# Patient Record
Sex: Female | Born: 1990 | Race: White | Hispanic: No | Marital: Single | Smoking: Never smoker
Health system: Southern US, Community
[De-identification: ages and names within clinical notes are randomized; demographics above are authoritative.]

## PROBLEM LIST (undated history)

## (undated) DIAGNOSIS — A048 Other specified bacterial intestinal infections: Secondary | ICD-10-CM

## (undated) DIAGNOSIS — F4329 Adjustment disorder with other symptoms: Secondary | ICD-10-CM

## (undated) HISTORY — DX: Adjustment disorder with other symptoms: F43.29

## (undated) HISTORY — DX: Other specified bacterial intestinal infections: A04.8

---

## 2010-05-20 ENCOUNTER — Ambulatory Visit (INDEPENDENT_AMBULATORY_CARE_PROVIDER_SITE_OTHER): Payer: Private Health Insurance - Indemnity | Admitting: Family Medicine

## 2010-05-20 ENCOUNTER — Encounter: Payer: Self-pay | Admitting: Family Medicine

## 2010-05-20 DIAGNOSIS — J020 Streptococcal pharyngitis: Secondary | ICD-10-CM

## 2010-05-26 NOTE — Assessment & Plan Note (Signed)
Summary: STOMACH PAIN,SORE THROAT,FEVER/TJ rm 4   Vital Signs:  Patient Profile:   20 Years Old Female CC:      stomach ache, sore throat, fever Height:     59 inches Weight:      114.50 pounds O2 Sat:      98 % O2 treatment:    Room Air Temp:     100.7 degrees F oral Pulse rate:   110 / minute Resp:     16 per minute BP sitting:   133 / 88  (left arm) Cuff size:   regular  Vitals Entered By: Clemens Catholic LPN (May 20, 2010 1:15 PM)                  Updated Prior Medication List: MICROGESTIN FE 1/20 1-20 MG-MCG TABS (NORETHIN ACE-ETH ESTRAD-FE)   Current Allergies: No known allergies History of Present Illness Chief Complaint: stomach ache, sore throat, fever History of Present Illness:  Subjective: Patient complains of sore throat that started last night.  She has been fatigued for about 3 days.  She also complains of intermittent recurring redness around her umbilicus.  For the past 3 days she has noted some intermittent vague tenderness in her right lower quadrant abdomen. No cough No pleuritic pain No wheezing No nasal congestion No post-nasal drainage No sinus pain/pressure No itchy/red eyes No earache No hemoptysis No SOB No nausea No vomiting No urinary symptoms  No diarrhea No skin rashes + myalgias + headache    REVIEW OF SYSTEMS Constitutional Symptoms       Complains of fever, chills, night sweats, and weight loss.     Denies weight gain and fatigue.  Eyes       Denies change in vision, eye pain, eye discharge, glasses, contact lenses, and eye surgery. Ear/Nose/Throat/Mouth       Complains of sore throat.      Denies hearing loss/aids, change in hearing, ear pain, ear discharge, dizziness, frequent runny nose, frequent nose bleeds, sinus problems, hoarseness, and tooth pain or bleeding.  Respiratory       Denies dry cough, productive cough, wheezing, shortness of breath, asthma, bronchitis, and emphysema/COPD.  Cardiovascular  Denies murmurs, chest pain, and tires easily with exhertion.    Gastrointestinal       Complains of stomach pain.      Denies nausea/vomiting, diarrhea, constipation, blood in bowel movements, and indigestion. Genitourniary       Denies painful urination, kidney stones, and loss of urinary control. Neurological       Complains of headaches.      Denies paralysis, seizures, and fainting/blackouts. Musculoskeletal       Complains of redness and swelling.      Denies muscle pain, joint pain, joint stiffness, decreased range of motion, muscle weakness, and gout.      Comments: spine hurts Skin       Denies bruising, unusual mles/lumps or sores, and hair/skin or nail changes.  Psych       Denies mood changes, temper/anger issues, anxiety/stress, speech problems, depression, and sleep problems. Other Comments: pt c/o sore throat, fever, stomach ache (comes and goes on the RLQ), HA, neck and spine hurts x 2days. she has taken IBF.  her mother also would like to have her belly button looked at, she states that it has been red for a while.    Past History:  Past Medical History: Unremarkable  Past Surgical History: Denies surgical history  Family History: Family History High  cholesterol Family History of Melanoma  Social History: Never Smoked Alcohol use-no Drug use-no Smoking Status:  never Drug Use:  no   Objective:  Appearance:  Patient appears healthy, stated age, and in no acute distress  Eyes:  Pupils are equal, round, and reactive to light and accomdation.  Extraocular movement is intact.  Conjunctivae are not inflamed.  Ears:  Canals normal.  Tympanic membranes normal.   Nose:  No congestion or sinus tenderness Pharynx:  Erythematous and slightly swollen without obstruction.  Nol exudate.  Neck:  Supple.  Tender shotty anterior nodes are palpated bilaterally.  Lungs:  Clear to auscultation.  Breath sounds are equal.  Heart:  Regular rate and rhythm without murmurs, rubs,  or gallops.  Abdomen:  Minimal tenderness right lower quadrant without masses or hepatosplenomegaly.  Bowel sounds are present.  No CVA or flank tenderness.  Negative iliopsoas and obdurator tests Skin:  Erythema around umbilicus.  No tenderness or swelling.  No discharge Rapid strep test positive. Assessment New Problems: PHARYNGITIS, STREPTOCOCCAL (ICD-034.0) FAMILY HISTORY OF MELANOMA (ICD-V16.8)   Plan New Medications/Changes: KETOCONAZOLE 2 % CREA (KETOCONAZOLE) Apply thin layer to affected area two times a day  #15gn x 1, 05/20/2010, Donna Christen MD PENICILLIN V POTASSIUM 500 MG TABS (PENICILLIN V POTASSIUM) 1 by mouth two times a day for 10 days  #20 x 0, 05/20/2010, Donna Christen MD  New Orders: Rapid Strep [15400] New Patient Level III [99203] Planning Comments:   Begin penicillin.  May take ibuprofen for pain/fever. Follow-up with PCP if not improving.   The patient and/or caregiver has been counseled thoroughly with regard to medications prescribed including dosage, schedule, interactions, rationale for use, and possible side effects and they verbalize understanding.  Diagnoses and expected course of recovery discussed and will return if not improved as expected or if the condition worsens. Patient and/or caregiver verbalized understanding.  Prescriptions: KETOCONAZOLE 2 % CREA (KETOCONAZOLE) Apply thin layer to affected area two times a day  #15gn x 1   Entered and Authorized by:   Donna Christen MD   Signed by:   Donna Christen MD on 05/20/2010   Method used:   Print then Give to Patient   RxID:   [id number] PENICILLIN V POTASSIUM 500 MG TABS (PENICILLIN V POTASSIUM) 1 by mouth two times a day for 10 days  #20 x 0   Entered and Authorized by:   Donna Christen MD   Signed by:   Donna Christen MD on 05/20/2010   Method used:   Print then Give to Patient   RxID:   [id number]   Patient Instructions: 1)  May take Ibuprofen 200mg , 4 tabs every 8 hours with food  for sore throat and headache.  Orders Added: 1)  Rapid Strep [67341] 2)  New Patient Level III [99203]    Laboratory Results  Date/Time Received: May 20, 2010 1:32 PM  Date/Time Reported: May 20, 2010 1:32 PM   Other Tests  Rapid Strep: positive  Kit Test Internal QC: Negative   (Normal Range: Negative)

## 2010-05-26 NOTE — Letter (Signed)
Summary: Out of Work  [facility]  [address]   [place], [place] [address]   Phone: [phone]  Fax: [phone]    May 20, 2010   Employee:  Miki Kins    To Whom It May Concern:   For Medical reasons, please excuse the above named employee from work today and tomorrow.   If you need additional information, please feel free to contact our office.         Sincerely,    Donna Christen MD

## 2010-05-26 NOTE — Letter (Signed)
Summary: Handout Printed  Printed Handout:  - Rheumatic Fever 

## 2010-12-10 ENCOUNTER — Encounter: Payer: Self-pay | Admitting: Family Medicine

## 2010-12-10 ENCOUNTER — Inpatient Hospital Stay (INDEPENDENT_AMBULATORY_CARE_PROVIDER_SITE_OTHER)
Admission: RE | Admit: 2010-12-10 | Discharge: 2010-12-10 | Disposition: A | Discharge status: Home / Self Care | Payer: Private Health Insurance - Indemnity | Source: Ambulatory Visit | Attending: Family Medicine | Admitting: Family Medicine

## 2010-12-10 DIAGNOSIS — R1031 Right lower quadrant pain: Secondary | ICD-10-CM

## 2010-12-10 DIAGNOSIS — M8530 Osteitis condensans, unspecified site: Secondary | ICD-10-CM

## 2010-12-10 LAB — CONVERTED CEMR LAB
Bilirubin Urine: NEGATIVE
Glucose, Urine, Semiquant: NEGATIVE
Ketones, urine, test strip: NEGATIVE
WBC Urine, dipstick: NEGATIVE
pH: 6

## 2010-12-12 ENCOUNTER — Telehealth (INDEPENDENT_AMBULATORY_CARE_PROVIDER_SITE_OTHER): Payer: Self-pay

## 2011-02-08 NOTE — Progress Notes (Signed)
Summary: Stomach Pain (rm 5)   Vital Signs:  Patient Profile:   20 Years Old Female CC:      RLQ Abdominal Pain x 1 week LMP:     11/26/2010 Height:     60 inches Weight:      119 pounds O2 Sat:      99 % O2 treatment:    Room Air Temp:     98.0 degrees F oral Pulse rate:   81 / minute Resp:     16 per minute BP sitting:   130 / 82  (left arm) Cuff size:   regular  Pt. in pain?   yes    Location:   abdomen    Type:       dull  Vitals Entered By: Lajean Saver RN (December 10, 2010 1:17 PM)  Menstrual History: LMP (date): 11/26/2010 LMP - Character: normal                   Updated Prior Medication List: MICROGESTIN FE 1/20 1-20 MG-MCG TABS (NORETHIN ACE-ETH ESTRAD-FE)   Current Allergies: No known allergies History of Present Illness Chief Complaint: RLQ Abdominal Pain x 1 week History of Present Illness:  Subjective:  Patient complains of onset of vague constant right lower quadrant pain  about one week ago that does not radiate.  The pain is possibly somewhat worse after eating.  Feels better standing.  No nausea/vomiting.  No fevers, chills, and sweats.  No change in bowel movements.  No urinary symptoms.  No pelvic pain.  No vaginal discharge.  Last menstrual period normal.  No rash.  No recent change in physical activities.    REVIEW OF SYSTEMS Constitutional Symptoms      Denies fever, chills, night sweats, weight loss, weight gain, and fatigue.  Eyes       Denies change in vision, eye pain, eye discharge, glasses, contact lenses, and eye surgery. Ear/Nose/Throat/Mouth       Denies hearing loss/aids, change in hearing, ear pain, ear discharge, dizziness, frequent runny nose, frequent nose bleeds, sinus problems, sore throat, hoarseness, and tooth pain or bleeding.  Respiratory       Denies dry cough, productive cough, wheezing, shortness of breath, asthma, bronchitis, and emphysema/COPD.  Cardiovascular       Denies murmurs, chest pain, and tires easily  with exhertion.    Gastrointestinal       Complains of stomach pain.      Denies nausea/vomiting, diarrhea, constipation, blood in bowel movements, and indigestion. Genitourniary       Denies painful urination, blood or discharge from vagina, kidney stones, and loss of urinary control. Neurological       Denies paralysis, seizures, and fainting/blackouts. Musculoskeletal       Denies muscle pain, joint pain, joint stiffness, decreased range of motion, redness, swelling, muscle weakness, and gout.  Skin       Denies bruising, unusual mles/lumps or sores, and hair/skin or nail changes.  Psych       Denies mood changes, temper/anger issues, anxiety/stress, speech problems, depression, and sleep problems. Other Comments: Patient c/o RLQ abdominal pain x 1 week. Denies urinary abnormalities or N/V/D   Past History:  Past Medical History: Reviewed history from 05/20/2010 and no changes required. Unremarkable  Past Surgical History: Reviewed history from 05/20/2010 and no changes required. Denies surgical history  Family History: Reviewed history from 05/20/2010 and no changes required. Family History High cholesterol Family History of Melanoma  Social History:  Never Smoked Alcohol use-yes Drug use-no   Objective:  Appearance:  Patient appears healthy, stated age, and in no acute distress  Eyes:  Pupils are equal, round, and reactive to light and accomodation.  Extraocular movement is intact.  Conjunctivae are not inflamed.  Neck:  Supple.  No adenopathy is present.   Lungs:  Clear to auscultation.  Breath sounds are equal.  Heart:  Regular rate and rhythm without murmurs, rubs, or gallops.  Abdomen:  Mild tenderness in the right lower quadrant over McBurney's point without masses or hepatosplenomegaly.  The tenderness extends to the symphysis pubis.  No pelvic area tenderness.  Bowel sounds are present.  No CVA or flank tenderness.  No rebound tenderness.  Negative iliopsoas and  obdurator tests Skin:  No rash Extremities:  No edema.  Resisted adduction of the right hip while palpating the symphysis pubis on right recreates her pain.  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  Plan New Orders: Urinalysis [CPT-81003] CBC w/Diff [16109-60454] Est. Patient Level IV [99214] Planning Comments:   Begin Ibuprofen 200mg , 4 tabs every 8 hours with food for about 5 to 7 days ([facility] information and instruction patient handout on pubic osteitis given).  Also given a [facility] patient information and instruction sheet on topic appendicitis. Recommend a trial of shoe insert in right shoe. Return for worsening symptoms such as nausea/vomiting, fever, increasing lower quadrant pain, vaginal discharge, etc.   The patient and/or caregiver has been counseled thoroughly with regard to medications prescribed including dosage, schedule, interactions, rationale for use, and possible side effects and they verbalize understanding.  Diagnoses and expected course of recovery discussed and will return if not improved as expected or if the condition worsens. Patient and/or caregiver verbalized understanding.   Orders Added: 1)  Urinalysis [CPT-81003] 2)  CBC w/Diff [[id number]] 3)  Est. Patient Level IV [29562]    Laboratory Results   Urine Tests  Date/Time Received: December 10, 2010 1:30 PM  Date/Time Reported: December 10, 2010 1:30 PM   Routine Urinalysis   Color: yellow Appearance: Clear Glucose: negative   (Normal Range: Negative) Bilirubin: negative   (Normal Range: Negative) Ketone: negative   (Normal Range: Negative) Spec. Gravity: 1.020   (Normal Range: 1.003-1.035) Blood: trace-intact   (Normal  Range: Negative) pH: 6.0   (Normal Range: 5.0-8.0) Protein: negative   (Normal Range: Negative) Urobilinogen: 0.2   (Normal Range: 0-1) Nitrite: negative   (Normal Range: Negative) Leukocyte Esterace: negative   (Normal Range: Negative)

## 2011-02-08 NOTE — Letter (Signed)
Summary: Out of Work  [facility]  [address]   [place], [place] [address]   Phone: [phone]  Fax: [phone]    December 10, 2010   Employee:  Miki Kins    To Whom It May Concern:   Maraki was evaluated in our clinic this afternoon.    If you need additional information, please feel free to contact our office.         Sincerely,    Donna Christen MD

## 2011-02-08 NOTE — Telephone Encounter (Signed)
  Phone Note Outgoing Call   Call placed by: Linton Flemings RN,  December 12, 2010 11:37 AM Call placed to: Patient Summary of Call: pt stated it's a little better but not much, instructed to f/u w/PCP

## 2011-06-07 ENCOUNTER — Encounter: Payer: Self-pay | Admitting: *Deleted

## 2011-06-07 ENCOUNTER — Emergency Department
Admission: EM | Admit: 2011-06-07 | Discharge: 2011-06-07 | Disposition: A | Discharge status: Home / Self Care | Payer: Private Health Insurance - Indemnity | Source: Home / Self Care

## 2011-06-07 DIAGNOSIS — J3489 Other specified disorders of nose and nasal sinuses: Secondary | ICD-10-CM

## 2011-06-07 DIAGNOSIS — J069 Acute upper respiratory infection, unspecified: Secondary | ICD-10-CM

## 2011-06-07 DIAGNOSIS — J029 Acute pharyngitis, unspecified: Secondary | ICD-10-CM

## 2011-06-07 LAB — POCT RAPID STREP A (OFFICE): Rapid Strep A Screen: NEGATIVE

## 2011-06-07 MED ORDER — AMOXICILLIN 500 MG PO CAPS: 500.0000 mg | ORAL_CAPSULE | Freq: Three times a day (TID) | ORAL | Status: AC

## 2011-06-07 NOTE — ED Provider Notes (Signed)
Agree with exam, assessment, and plan.   Lattie Haw, MD 06/07/11 [id number]

## 2011-06-07 NOTE — Discharge Instructions (Signed)
Increase fluids, get plenty of rest.  Begin antihistamine of your choice.  Take tylenol or motrin for pain/discomfort.  Fill prescription in 4 days if symptoms have not improved.    Sore Throat Sore throats may be caused by bacteria and viruses. They may also be caused by:  Smoking.   Pollution.   Allergies.  If a sore throat is due to strep infection (a bacterial infection), you may need:  A throat swab.   A culture test to verify the strep infection.  You will need one of these:  An antibiotic shot.   Oral medicine for a full 10 days.  Strep infection is very contagious. A doctor should check any close contacts who have a sore throat or fever. A sore throat caused by a virus infection will usually last only 3-4 days. Antibiotics will not treat a viral sore throat.  Infectious mononucleosis (a viral disease), however, can cause a sore throat that lasts for up to 3 weeks. Mononucleosis can be diagnosed with blood tests. You must have been sick for at least 1 week in order for the test to give accurate results. HOME CARE INSTRUCTIONS   To treat a sore throat, take mild pain medicine.   Increase your fluids.   Eat a soft diet.   Do not smoke.   Gargling with warm water or salt water (1 tsp. salt in 8 oz. water) can be helpful.   Try throat sprays or lozenges or sucking on hard candy to ease the symptoms.  Call your doctor if your sore throat lasts longer than 1 week.  SEEK IMMEDIATE MEDICAL CARE IF:  You have difficulty breathing.   You have increased swelling in the throat.   You have pain so severe that you are unable to swallow fluids or your saliva.   You have a severe headache, a high fever, vomiting, or a red rash.  Document Released: 04/01/2004 Document Revised: 02/11/2011 Document Reviewed: 02/09/2007 [facility] Patient Information 2012 [place], [place].

## 2011-06-07 NOTE — ED Notes (Signed)
Pt  Complains of headaches for 2 wks and sore throat with, cough, runny nose, diarrhea, and nausea for 3 days.

## 2011-06-07 NOTE — ED Provider Notes (Signed)
History     CSN: [id number]  Arrival date & time 06/07/11  1206   None     Chief Complaint  Patient presents with  . Sore Throat  . Headache    (Consider location/radiation/quality/duration/timing/severity/associated sxs/prior treatment) Patient is a 21 y.o. female presenting with pharyngitis and headaches.  Sore Throat Associated symptoms include headaches.  Headache The primary symptoms include headaches.  Davonda is a 21 y.o. female who complains of onset of sinus pressure for 10 days.  + sore throat since yesterday + cough No pleuritic pain No wheezing + nasal congestion + post-nasal drainage + sinus pain/pressure No chest congestion No itchy/red eyes No earache No hemoptysis No SOB + chills/sweats No fever No nausea No vomiting No abdominal pain + diarrhea No skin rashes No fatigue No myalgias + headache    History reviewed. No pertinent past medical history.  History reviewed. No pertinent past surgical history.  History reviewed. No pertinent family history.  History  Substance Use Topics  . Smoking status: Never Smoker   . Smokeless tobacco: Not on file  . Alcohol Use: Yes    OB History    Grav Para Term Preterm Abortions TAB SAB Ect Mult Living                  Review of Systems  Neurological: Positive for headaches.  All other systems reviewed and are negative.    Allergies  Review of patient's allergies indicates no known allergies.  Home Medications   Current Outpatient Rx  Name Route Sig Dispense Refill  . DESOGESTREL-ETHINYL ESTRADIOL 0.15-30 MG-MCG PO TABS Oral Take 1 tablet by mouth daily.    . AMOXICILLIN 500 MG PO CAPS Oral Take 1 capsule (500 mg total) by mouth 3 (three) times daily. 21 capsule 0    BP 121/86  Pulse 98  Temp(Src) 98.4 F (36.9 C) (Oral)  Resp 14  Ht 5' (1.524 m)  Wt 117 lb (53.071 kg)  BMI 22.85 kg/m2  SpO2 98%  Physical Exam  Constitutional: She is oriented to person, place, and time.  Vital signs are normal. She appears well-developed and well-nourished. She is active and cooperative.  HENT:  Head: Normocephalic.  Right Ear: Hearing, tympanic membrane, external ear and ear canal normal.  Left Ear: Hearing, tympanic membrane, external ear and ear canal normal.  Nose: Rhinorrhea present.  Mouth/Throat: Uvula is midline and mucous membranes are normal. Posterior oropharyngeal erythema present.  Eyes: Conjunctivae and lids are normal. Pupils are equal, round, and reactive to light. No foreign bodies found. No scleral icterus.  Neck: Trachea normal. Neck supple.  Cardiovascular: Normal rate and regular rhythm.   Pulmonary/Chest: Effort normal and breath sounds normal.  Lymphadenopathy:    She has no cervical adenopathy.  Neurological: She is alert and oriented to person, place, and time. No cranial nerve deficit or sensory deficit.  Skin: Skin is warm and dry.  Psychiatric: She has a normal mood and affect. Her speech is normal and behavior is normal. Judgment and thought content normal. Cognition and memory are normal.    ED Course  Procedures   Labs Reviewed  POCT RAPID STREP A (OFFICE)   No results found.   1. Sore throat   2. Sinus pain   3. Upper respiratory infection       MDM  Strep test was negative.  Begin expectorant/decongestant, topical decongestant, saline nasal spray and/or saline irrigation, and cough suppressant at bedtime.  Antihistamines of your choice. Increase fluid intake,  rest.  Fill presciption if symptoms are not improved in 4 days.     Johnsie Kindred, NP 06/07/11 1335  Johnsie Kindred, NP 06/07/11 1336

## 2012-04-04 ENCOUNTER — Ambulatory Visit (INDEPENDENT_AMBULATORY_CARE_PROVIDER_SITE_OTHER): Payer: Private Health Insurance - Indemnity | Admitting: Licensed Clinical Social Worker

## 2012-04-04 DIAGNOSIS — F4329 Adjustment disorder with other symptoms: Secondary | ICD-10-CM

## 2012-04-04 DIAGNOSIS — F4323 Adjustment disorder with mixed anxiety and depressed mood: Secondary | ICD-10-CM

## 2012-04-04 NOTE — Progress Notes (Signed)
Presenting Problem Chief Complaint: Roda decided to come to counseling because she finds herself in an irritable angry mood a lot.  Other people report this and so she needs to wok on her anger issues.  She does not overreact, yell or throw things she mainly gets in a bad mood and is snippy.  She is in a relationship with Durene Cal and they are going to be married after she finishes college.  He is in the [employer] and is in [place] - they do a lot of face time to keep in touch more.  His family goes to visit and she is able to go with them.  He has been home for holidays but they spent a lot of time with his family. Things go the his mother wants them to go.  She was closer to dad during her teen years than her mom.  She is close to both now.  She is not sure what triggers her anger or how to deal with whatever is bothering her.    What are the main stressors in your life right now, how long? Mood Swings  2 and Irritability   2   Previous mental health services Have you ever been treated for a mental health problem, when, where, by whom? No     Are you currently seeing a therapist or counselor, counselor's name? No   Have you ever had a mental health hospitalization, how many times, length of stay? No   Have you ever been treated with medication, name, reason, response? No   Have you ever had suicidal thoughts or attempted suicide, when, how? No   Risk factors for Suicide Demographic factors:  Adolescent or young adult and Caucasian Current mental status: none Loss factors: none Historical factors: none Risk Reduction factors: Employed, Living with another person, especially a relative, Positive social support and Positive coping skills or problem solving skills Clinical factors:  none Cognitive features that contribute to risk:  none   SUICIDE RISK:  Minimal: No identifiable suicidal ideation.  Patients presenting with no risk factors but with morbid ruminations; may be classified as  minimal risk based on the severity of the depressive symptoms  Medical history Medical treatment and/or problems, explain: No  Do you have any issues with chronic pain?  No  Name of primary care physician/last physical exam: none - uses Urgent Care  Allergies: No Medication, reactions? None known   Current medications: none Prescribed by: NA Is there any history of mental health problems or substance abuse in your family, whom? No  Has anyone in your family been hospitalized, who, where, length of stay? No   Social/family history Have you been married, how many times?  No  Do you have children?  No  How many pregnancies have you had?  None  Who lives in your current household? Lives with sister Luther Parody and mother  Military history: No   Religious/spiritual involvement:  What religion/faith base are you? Not know  Family of origin (childhood history)  Where were you born? [place] Where did you grow up? [place] How many different homes have you lived? 3 Describe the atmosphere of the household where you grew up: Felt like it was comfortable Do you have siblings, step/half siblings, list names, relation, sex, age? Yes  One younger sister Luther Parody who is in college now  Are your parents separated/divorced, when and why? Yes  Separated after a 22 year marriage in 2012 on the birthday of Luther Parody and herself (yes they  have the same BD)   It was a surprise and upsetting.  They are used it now.  She gets along with both parents.    Are your parents alive? Yes   Social supports (personal and professional): Hunter, BF, mother, father and sister  Education How many grades have you completed? student in college Did you have any problems in school, what type? No  Medications prescribed for these problems? 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 No   Have you ever been exposed to something traumatic, describe? No   Substance use Do you use Caffeine? Yes Type, frequency? Soda , coffee  Do you use Nicotine? No Type, frequency, ppd? NA  Do you use Alcohol? Yes Type, frequency? Social only  How old were you went you first tasted alcohol? She was of age Was this accepted by your family?   When was your last drink, type, how much? ?  Have you ever used illicit drugs or taken more than prescribed, type, frequency, date of last usage? 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  AXIS IV Expression of anger  AXIS V 61-70 mild symptoms   Plan: Develop rapport and develop treatment plan   _________________________________________            Merlene Morse, LCSW  Date  04/04/12

## 2012-04-06 ENCOUNTER — Encounter (HOSPITAL_COMMUNITY): Payer: Self-pay | Admitting: Licensed Clinical Social Worker

## 2012-04-06 DIAGNOSIS — F4329 Adjustment disorder with other symptoms: Secondary | ICD-10-CM | POA: Insufficient documentation

## 2012-04-06 HISTORY — DX: Adjustment disorder with other symptoms: F43.29

## 2012-04-11 ENCOUNTER — Ambulatory Visit (INDEPENDENT_AMBULATORY_CARE_PROVIDER_SITE_OTHER): Payer: Private Health Insurance - Indemnity | Admitting: Licensed Clinical Social Worker

## 2012-04-11 DIAGNOSIS — F4329 Adjustment disorder with other symptoms: Secondary | ICD-10-CM

## 2012-04-23 NOTE — Progress Notes (Signed)
   THERAPIST PROGRESS NOTE  Session Time: 3:00 - 3:50  Participation Level: Active  Behavioral Response: NeatAlertEuthymic  Type of Therapy: Individual Therapy  Treatment Goals addressed: Anger  Interventions: Motivational Interviewing and Supportive  Summary: Ann Black is a 22 y.o. female who presents with concerns about her anger.  Time was taken to finish assessment.  Ann Black reported that she has a bad attitude at work. She feels she is not a nice person at work.  She was angry and not really know why.  She was complaining to [facility] and she did not feel he was very empathetic and she was hurt and angry. She did finally realized that he was trying to shift her mood and that it is the same complaints that she usually has about work.  She knows she did not want to be there - she would rather be around more intellectual people.  She does know that she does not like to be vulnerable so she will not cry in front of people. She and Ann Black had some tension around this but they were able to start to relax and watched a movie together on Face time.  Ann Black has a BD coming up and she will go with family to see him - she would love to have time alone with him.  He will be out of the [employer] in 14 months and he will be going to school to be a [place] [employer].   She will work on trying to figure out what triggers her anger.  Suicidal/Homicidal: No  Therapist Response: Gave her the assignment to notice what was happening before her mood changed and what happened after.  Plan: Return again in 2 weeks.  Diagnosis: Axis I: Adjustment Disorder with Mixed Emotional Features    Axis II: Deferred    Ann Black,[name], LCSW 04/23/2012

## 2012-04-25 ENCOUNTER — Ambulatory Visit (HOSPITAL_COMMUNITY): Payer: Self-pay | Admitting: Licensed Clinical Social Worker

## 2012-05-30 ENCOUNTER — Encounter: Payer: Self-pay | Admitting: Emergency Medicine

## 2012-05-30 ENCOUNTER — Emergency Department
Admission: EM | Admit: 2012-05-30 | Discharge: 2012-05-30 | Disposition: A | Discharge status: Home / Self Care | Payer: Private Health Insurance - Indemnity | Source: Home / Self Care | Attending: Family Medicine | Admitting: Family Medicine

## 2012-05-30 DIAGNOSIS — J029 Acute pharyngitis, unspecified: Secondary | ICD-10-CM

## 2012-05-30 DIAGNOSIS — J069 Acute upper respiratory infection, unspecified: Secondary | ICD-10-CM

## 2012-05-30 LAB — POCT RAPID STREP A (OFFICE): Rapid Strep A Screen: NEGATIVE

## 2012-05-30 NOTE — ED Provider Notes (Signed)
History     CSN: [id number]  Arrival date & time 05/30/12  1909   First MD Initiated Contact with Patient 05/30/12 1934      Chief Complaint  Patient presents with  . Sore Throat   HPI  URI Symptoms Onset: 1 day Description: rhinorrhea, nasal congestion, body aches chills, mild sore throat  Modifying factors:  None   Symptoms Nasal discharge: yes Fever: subjective at home  Sore throat: mild Cough: no Wheezing: no Ear pain: no GI symptoms: no Sick contacts: yes; multiple   Red Flags  Stiff neck: no Dyspnea: no Rash: no Swallowing difficulty: no  Sinusitis Risk Factors Headache/face pain: no Double sickening: no tooth pain: no  Allergy Risk Factors Sneezing: no Itchy scratchy throat: no Seasonal symptoms: no  Flu Risk Factors Headache: mild muscle aches: no severe fatigue: no   History reviewed. No pertinent past medical history.  History reviewed. No pertinent past surgical history.  Family History  Problem Relation Age of Onset  . Hyperlipidemia Father     History  Substance Use Topics  . Smoking status: Never Smoker   . Smokeless tobacco: Never Used  . Alcohol Use: Yes    OB History   Grav Para Term Preterm Abortions TAB SAB Ect Mult Living                  Review of Systems  All other systems reviewed and are negative.    Allergies  Review of patient's allergies indicates not on file.  Home Medications   Current Outpatient Rx  Name  Route  Sig  Dispense  Refill  . desogestrel-ethinyl estradiol (APRI,EMOQUETTE,SOLIA) 0.15-30 MG-MCG tablet   Oral   Take 1 tablet by mouth daily.           BP 127/82  Pulse 77  Temp(Src) 98 F (36.7 C) (Oral)  Ht 5\' 1"  (1.549 m)  Wt 115 lb (52.164 kg)  BMI 21.74 kg/m2  SpO2 96%  Physical Exam  Constitutional: She appears well-developed and well-nourished.  HENT:  Head: Normocephalic and atraumatic.  Right Ear: External ear normal.  Left Ear: External ear normal.  Mouth/Throat:  Oropharynx is clear and moist.  +nasal erythema, rhinorrhea bilaterally, + post oropharyngeal erythema    Eyes: Pupils are equal, round, and reactive to light.  Neck: Normal range of motion.  Mild cervical LAD     Cardiovascular: Normal rate, regular rhythm and normal heart sounds.   Pulmonary/Chest: Effort normal.  Abdominal: Soft.  Musculoskeletal: Normal range of motion.  Neurological: She is alert.  Skin: Skin is warm.    ED Course  Procedures (including critical care time)  Labs Reviewed  POCT RAPID STREP A (OFFICE)   No results found.   1. Acute pharyngitis       MDM  Rapid strep negative.  Will culture Likely viral illness.  Discussed supportive care and infectious red flags.  Follow up as needed.     The patient and/or caregiver has been counseled thoroughly with regard to treatment plan and/or medications prescribed including dosage, schedule, interactions, rationale for use, and possible side effects and they verbalize understanding. Diagnoses and expected course of recovery discussed and will return if not improved as expected or if the condition worsens. Patient and/or caregiver verbalized understanding.             Doree Albee, MD 05/30/12 1949

## 2012-05-30 NOTE — ED Notes (Signed)
Sore throat, nausea, chills, body aches, congestion x 1 day

## 2012-07-07 ENCOUNTER — Encounter: Payer: Self-pay | Admitting: Emergency Medicine

## 2012-07-07 ENCOUNTER — Emergency Department
Admission: EM | Admit: 2012-07-07 | Discharge: 2012-07-07 | Disposition: A | Discharge status: Home / Self Care | Payer: Private Health Insurance - Indemnity | Source: Home / Self Care | Attending: Family Medicine | Admitting: Family Medicine

## 2012-07-07 DIAGNOSIS — R197 Diarrhea, unspecified: Secondary | ICD-10-CM

## 2012-07-07 DIAGNOSIS — R110 Nausea: Secondary | ICD-10-CM

## 2012-07-07 MED ORDER — PROMETHAZINE HCL 25 MG PO TABS: 25.0000 mg | ORAL_TABLET | Freq: Four times a day (QID) | ORAL | Status: DC | PRN

## 2012-07-07 NOTE — ED Notes (Signed)
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 10 days ago had food poisoning, with vomiting and diarrhea, episode lasted several hours.

## 2012-07-07 NOTE — ED Provider Notes (Signed)
History     CSN: [id number]  Arrival date & time 07/07/12  1341   First MD Initiated Contact with Patient 07/07/12 1403      Chief Complaint  Patient presents with  . Nausea       HPI Comments: Four days ago patient developed abdominal pain associated with headache, myalgias, fatigue, and chills.  Three days ago she developed watery diarrhea, nausea, and chills/sweats.  She has not vomited.  Two days ago she felt better, but developed diarrhea again after eating.  She has had no diarrhea today but still has nausea.  Denies recent foreign travel, or drinking untreated water in a wilderness environment.   Patient is a 22 y.o. female presenting with diarrhea. The history is provided by the patient.  Diarrhea Quality:  Watery Severity:  Moderate Onset quality:  Sudden Duration:  3 days Timing:  Intermittent Progression:  Improving Relieved by:  Nothing Exacerbated by: eating. Ineffective treatments:  None tried Associated symptoms: abdominal pain, chills, headaches and myalgias   Associated symptoms: no vomiting   Risk factors: sick contacts   Risk factors: no recent antibiotic use and no travel to endemic areas     History reviewed. No pertinent past medical history.  History reviewed. No pertinent past surgical history.  Family History  Problem Relation Age of Onset  . Hyperlipidemia Father   . Hypertension Father     History  Substance Use Topics  . Smoking status: Never Smoker   . Smokeless tobacco: Never Used  . Alcohol Use: Yes    OB History   Grav Para Term Preterm Abortions TAB SAB Ect Mult Living                  Review of Systems  Constitutional: Positive for chills.  Gastrointestinal: Positive for abdominal pain and diarrhea. Negative for vomiting.  Musculoskeletal: Positive for myalgias.  Neurological: Positive for headaches.  All other systems reviewed and are negative.    Allergies  Review of patient's allergies indicates no known  allergies.  Home Medications   Current Outpatient Rx  Name  Route  Sig  Dispense  Refill  . desogestrel-ethinyl estradiol (APRI,EMOQUETTE,SOLIA) 0.15-30 MG-MCG tablet   Oral   Take 1 tablet by mouth daily.         . promethazine (PHENERGAN) 25 MG tablet   Oral   Take 1 tablet (25 mg total) by mouth every 6 (six) hours as needed for nausea.   12 tablet   0     BP 129/87  Pulse 77  Temp(Src) 98.2 F (36.8 C) (Oral)  Ht 5\' 2"  (1.575 m)  Wt 114 lb (51.71 kg)  BMI 20.85 kg/m2  SpO2 100%  LMP 06/16/2012  Physical Exam Nursing notes and Vital Signs reviewed. Appearance:  Patient appears healthy, stated age, and in no acute distress Eyes:  Pupils are equal, round, and reactive to light and accomodation.  Extraocular movement is intact.  Conjunctivae are not inflamed  Ears:  Canals normal.  Tympanic membranes normal.  Nose:  Mildly congested turbinates.  No sinus tenderness.   Mouth/Pharynx:  Normal; moist mucous membranes  Neck:  Supple.  No adenopathy  Lungs:  Clear to auscultation.  Breath sounds are equal.  Heart:  Regular rate and rhythm without murmurs, rubs, or gallops.  Abdomen:   Mild peri-umbilical tenderness without masses or hepatosplenomegaly.  Bowel sounds are increased.  No CVA or flank tenderness.  Extremities:  No edema.  No calf tenderness Skin:  No  rash present.   ED Course  Procedures  none      1. Nausea alone; suspect viral gastroenteritis   2. Diarrhea       MDM  Rx written for Phenergan 25mg  Begin clear liquids (Pedialyte while having diarrhea) until improved, then advance to a BRAT diet.  Then gradually resume a regular diet when tolerated.  Avoid milk products until well.  To decrease diarrhea, mix one heaping tablespoon Citrucel (methylcellulose) in 8 oz water and drink one to three times daily.  When stools become more formed, may take Imodium (loperamide) once or twice daily to decrease stool frequency.  If symptoms become significantly  worse during the night or over the weekend, proceed to the local emergency room. Followup with Family Doctor if not improved in about 5 days.        Lattie Haw, MD 07/07/12 [phone]

## 2012-09-14 ENCOUNTER — Ambulatory Visit (INDEPENDENT_AMBULATORY_CARE_PROVIDER_SITE_OTHER): Payer: Private Health Insurance - Indemnity | Admitting: Family Medicine

## 2012-09-14 ENCOUNTER — Encounter: Payer: Self-pay | Admitting: Family Medicine

## 2012-09-14 VITALS — BP 124/82 | HR 57 | Ht 62.0 in | Wt 118.0 lb

## 2012-09-14 DIAGNOSIS — J329 Chronic sinusitis, unspecified: Secondary | ICD-10-CM

## 2012-09-14 DIAGNOSIS — A499 Bacterial infection, unspecified: Secondary | ICD-10-CM

## 2012-09-14 DIAGNOSIS — F411 Generalized anxiety disorder: Secondary | ICD-10-CM

## 2012-09-14 DIAGNOSIS — B9689 Other specified bacterial agents as the cause of diseases classified elsewhere: Secondary | ICD-10-CM

## 2012-09-14 MED ORDER — AMOXICILLIN-POT CLAVULANATE 500-125 MG PO TABS: ORAL_TABLET | ORAL | Status: AC

## 2012-09-14 MED ORDER — CITALOPRAM HYDROBROMIDE 20 MG PO TABS: ORAL_TABLET | ORAL | Status: DC

## 2012-09-14 NOTE — Progress Notes (Signed)
CC: Ann Black is a 22 y.o. female is here for Establish Care and Anxiety   Subjective: HPI:  Very pleasant 22 year old here to establish care  Patient complains of a little over 3 months of daily irritability, subjective anxiety, and nervousness that is described as moderate in severity. Treatment has included counseling which she feels has not helped her overall severity and frequency of above symptoms. Symptoms seem to be worse since divorce of her parents and since her boyfriend moved to [place] with the [employer]. 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 Symptoms occur on a daily basis all hours of the day mildly interfering with sleep. She denies paranoia, depression, hallucinations, manic-like episodes, nor thoughts of wanting to harm herself or others. She denies tobacco alcohol or drug use recently or remotely.  She complains of 3 weeks of worsening facial pressure with nasal congestion that is of mild severity. Present all hours of the day worse in the morning and evenings.  Nasal discharge is worsening on a daily basis, cough for the last week has been present which is nonproductive and worse when lying flat.  Review of Systems - General ROS: negative for - chills, fever, night sweats, weight gain or weight loss Ophthalmic ROS: negative for - decreased vision ENT ROS: negative for - hearing change,  tinnitus or allergies Hematological and Lymphatic ROS: negative for - bleeding problems, bruising or swollen lymph nodes Breast ROS: negative Respiratory ROS: no , shortness of breath, or wheezing Cardiovascular ROS: no chest pain or dyspnea on exertion Gastrointestinal ROS: no abdominal pain, change in bowel habits, or black or bloody stools Genito-Urinary ROS: negative for - genital discharge, genital ulcers, incontinence or abnormal bleeding from  genitals Musculoskeletal ROS: negative for - joint pain or muscle pain Neurological ROS: negative for - headaches or memory loss Dermatological ROS: negative for lumps, mole changes, rash and skin lesion changes  Past Medical History  Diagnosis Date  . 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 Hyperlipidemia Father   . Hypertension Father      History  Substance Use Topics  . Smoking status: Never Smoker   . Smokeless tobacco: Never Used  . Alcohol Use: 0.0 oz/week    0 drink(s) per week     Comment: 1-2 x  a month     Objective: Filed Vitals:   09/14/12 0931  BP: 124/82  Pulse: 57    General: Alert and Oriented, No Acute Distress HEENT: Pupils equal, round, reactive to light. Conjunctivae clear.  External ears unremarkable, canals clear with intact TMs with appropriate landmarks.  Bilateral middle ears with mild serous effusion. Pink inferior turbinates.  Moist mucous membranes, pharynx without inflammation nor lesions.  Neck supple without palpable lymphadenopathy nor abnormal masses. Maxillary sinus tenderness on the left to percussion Lungs: Clear to auscultation bilaterally, no wheezing/ronchi/rales.  Comfortable work of breathing. Good air movement. Cardiac: Regular rate and rhythm. Normal S1/S2.  No murmurs, rubs, nor gallops.   Abdomen: Normal bowel sounds, soft and non tender without palpable masses. Extremities: No peripheral edema.  Strong peripheral pulses.  Mental Status: No depression, anxiety, nor agitation. Skin: Warm and dry.  Assessment & Plan: Ann Black was seen today for establish care and anxiety.  Diagnoses and associated orders for this visit:  Generalized anxiety disorder - citalopram (  CELEXA) 20 MG tablet; Start with half tab by mouth daily for five days then full tablet daily.  Bacterial sinusitis - amoxicillin-clavulanate (AUGMENTIN) 500-125 MG per tablet; Take one by mouth  every 8 hours for ten total days.    Generalized anxiety disorder: Discussed this diagnosis with patient I believe it is an uncontrolled chronic condition, she plans on continuing with behavioral therapy  But is interested , logical intervention therefore we'll start citalopram and have her return in 4 weeks, call for side effects Bacterial sinusitis: Start Augmentin consider Alka-Seltzer cold and sinus for symptom control  Return in about 4 weeks (around 10/12/2012).

## 2012-09-25 ENCOUNTER — Telehealth: Payer: Self-pay | Admitting: *Deleted

## 2012-09-25 DIAGNOSIS — B9689 Other specified bacterial agents as the cause of diseases classified elsewhere: Secondary | ICD-10-CM

## 2012-09-25 MED ORDER — LEVOFLOXACIN 500 MG PO TABS: 500.0000 mg | ORAL_TABLET | Freq: Every day | ORAL | Status: DC

## 2012-09-25 NOTE — Telephone Encounter (Signed)
levaquin sent to [facility] as substitute to amoxicillin, she can stop taking the amoxicillin but start new 8 day course of levofloxacin

## 2012-09-25 NOTE — Telephone Encounter (Signed)
Pt calls and states seen 2 weeks ago for sinus infection and given Amoxicillan then Saturday night headaches, sore throat, aches, facial pain, cough, sinus congestion stqarted coming back. Still taking the meds has 5 pills left. Please advise

## 2012-09-26 NOTE — Telephone Encounter (Signed)
vm has not been set up yet °

## 2012-09-28 NOTE — Telephone Encounter (Signed)
Pt.notified

## 2012-10-13 ENCOUNTER — Ambulatory Visit (HOSPITAL_COMMUNITY): Payer: Self-pay | Admitting: Psychiatry

## 2012-10-13 ENCOUNTER — Ambulatory Visit (INDEPENDENT_AMBULATORY_CARE_PROVIDER_SITE_OTHER): Payer: Private Health Insurance - Indemnity | Admitting: Family Medicine

## 2012-10-13 ENCOUNTER — Encounter: Payer: Self-pay | Admitting: Family Medicine

## 2012-10-13 VITALS — BP 120/76 | HR 64 | Wt 115.0 lb

## 2012-10-13 DIAGNOSIS — F411 Generalized anxiety disorder: Secondary | ICD-10-CM

## 2012-10-13 DIAGNOSIS — R197 Diarrhea, unspecified: Secondary | ICD-10-CM

## 2012-10-13 MED ORDER — AMBULATORY NON FORMULARY MEDICATION: 1.0000 | Freq: Every day | Status: DC

## 2012-10-13 MED ORDER — CITALOPRAM HYDROBROMIDE 20 MG PO TABS: ORAL_TABLET | ORAL | Status: DC

## 2012-10-13 NOTE — Progress Notes (Signed)
CC: Ann Black is a 22 y.o. female is here for f/u anxiety   Subjective: HPI:  Followup anxiety: Continues on citalopram 20 mg daily she reports improvement with no longer having anxiety work has had mild improvement with anxiety while driving and with daily activities. She is happy with response but wonders about room for improvement. There are no thoughts wanting to harm herself or others she denies depression or mental disturbance. She believes she might have some restlessness as a side effect but no other side effects.  Patient complains of diarrhea that has been present ever since using Augmentin. It was daily while using, since stopping will have good days and bad days. Bad days described as diarrhea which is loose not black and without blood occurring every hour up to 5-6 times a day. No interventions as of yet. She denies abdominal pain nausea or vomiting she denies fevers chills or fatigue  Review Of Systems Outlined In HPI  Past Medical History  Diagnosis Date  . 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 Hyperlipidemia Father   . Hypertension Father      History  Substance Use Topics  . Smoking status: Never Smoker   . Smokeless tobacco: Never Used  . Alcohol Use: 0.0 oz/week    0 drink(s) per week     Comment: 1-2 x  a month     Objective: Filed Vitals:   10/13/12 0951  BP: 120/76  Pulse: 64    Vital signs reviewed. General: Alert and Oriented, No Acute Distress HEENT: Pupils equal, round, reactive to light. Conjunctivae clear.  External ears unremarkable.  Moist mucous membranes. Lungs: Clear and comfortable work of breathing, speaking in full sentences without accessory muscle use. Cardiac: Regular rate and rhythm.  Abdomen: Soft and nontender Extremities: No peripheral edema.  Strong peripheral pulses.  Mental Status: No depression, anxiety, nor agitation. Logical though process. Skin:  Warm and dry.  Assessment & Plan: Ann Black was seen today for f/u anxiety.  Diagnoses and associated orders for this visit:  Diarrhea - Clostridium difficile EIA - AMBULATORY NON FORMULARY MEDICATION; Take 1 tablet by mouth daily. Medication Name: Probiotic one tablet daily for one week  Generalized anxiety disorder - citalopram (CELEXA) 20 MG tablet; 1.5 tablets daily by mouth.    Diarrhea: We'll rule out C. difficile caused by levofloxacin and Augmentin, she was given 7 days of probiotics and encouraged to avoid lactose products until the above labs returns Anxiety: Improving, joint decision to increase daily regimen to 30 mg followup in one month, go back to 20 mg if restlessness is worsened  Return in about 4 weeks (around 11/10/2012) for Anxiety Check.

## 2012-10-17 ENCOUNTER — Telehealth: Payer: Self-pay | Admitting: *Deleted

## 2012-10-17 DIAGNOSIS — A0472 Enterocolitis due to Clostridium difficile, not specified as recurrent: Secondary | ICD-10-CM | POA: Insufficient documentation

## 2012-10-17 LAB — CLOSTRIDIUM DIFFICILE EIA: CDIFTX: POSITIVE

## 2012-10-17 MED ORDER — METRONIDAZOLE 500 MG PO TABS: 500.0000 mg | ORAL_TABLET | Freq: Three times a day (TID) | ORAL | Status: AC

## 2012-10-17 NOTE — Telephone Encounter (Signed)
Patient updated, will f/u with me on Monday if not considerably better.  Discussed contagiousness.

## 2012-10-17 NOTE — Telephone Encounter (Signed)
Laurelyn Sickle from [facility] called to inform us that the patient's c-diff actually came back positive instead of negative.  I had her fax [place] a copy of the result but I wanted to give you a heads up.

## 2012-12-04 ENCOUNTER — Telehealth: Payer: Self-pay | Admitting: *Deleted

## 2012-12-04 DIAGNOSIS — F411 Generalized anxiety disorder: Secondary | ICD-10-CM

## 2012-12-04 NOTE — Telephone Encounter (Addendum)
Pt called requesting a refill on her citalopram. Pt is not actually due until 10/2 but pt states she said that she discussed using 40mg  of the citalopram and that's what she has been taking. Pt states she does feel better on the 40 mg dose of the citalopram. Pt has been out of her medication for one day. She is due for an app

## 2012-12-05 MED ORDER — CITALOPRAM HYDROBROMIDE 40 MG PO TABS: ORAL_TABLET | ORAL | Status: DC

## 2012-12-05 NOTE — Telephone Encounter (Signed)
New dose of 40mg  sent to her pharmacy on file

## 2012-12-05 NOTE — Telephone Encounter (Signed)
Pt.notified

## 2012-12-27 ENCOUNTER — Encounter: Payer: Self-pay | Admitting: Family Medicine

## 2012-12-27 ENCOUNTER — Ambulatory Visit (INDEPENDENT_AMBULATORY_CARE_PROVIDER_SITE_OTHER): Payer: Private Health Insurance - Indemnity | Admitting: Family Medicine

## 2012-12-27 VITALS — BP 115/82 | HR 71 | Wt 117.0 lb

## 2012-12-27 DIAGNOSIS — F341 Dysthymic disorder: Secondary | ICD-10-CM

## 2012-12-27 DIAGNOSIS — F32A Depression, unspecified: Secondary | ICD-10-CM

## 2012-12-27 DIAGNOSIS — F329 Major depressive disorder, single episode, unspecified: Secondary | ICD-10-CM

## 2012-12-27 MED ORDER — VENLAFAXINE HCL ER 75 MG PO CP24: ORAL_CAPSULE | ORAL | Status: DC

## 2012-12-27 NOTE — Patient Instructions (Signed)
Take only one half tablet of citalopram for four days then start on the single dose of venlafaxine daily for a week, then two caps of venlafaxine daily thereafter.

## 2012-12-27 NOTE — Progress Notes (Signed)
CC: Ann Black is a 22 y.o. female is here for Anxiety   Subjective: HPI:  Patient complains of worsening anxiety and depression that has been worsening over the past month, worse on a weekly basis. Now moderate in severity on a daily basis. Present all hours of the day. Worse when being judged by others or when having serious conversations with her long distance boyfriend. Recently the 2 of them broke up which has made matters worse. Depression is described as feelings of guilt, anxiety is described as irritability in general anxious this along with worrying about things in her life being particular and orderly. Symptoms are interfering with quality of life.  Nothing currently is making symptoms better citalopram has lost its effect about a month ago. Denies paranoia, hallucinations, thoughts wanting to harm self or others. Denies unintentional weight loss, fevers, chills, nausea or diarrhea   Review Of Systems Outlined In HPI  Past Medical History  Diagnosis Date  . 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 Hyperlipidemia Father   . Hypertension Father      History  Substance Use Topics  . Smoking status: Never Smoker   . Smokeless tobacco: Never Used  . Alcohol Use: 0.0 oz/week    0 drink(s) per week     Comment: 1-2 x  a month     Objective: Filed Vitals:   12/27/12 1507  BP: 115/82  Pulse: 71   Vital signs reviewed. General: Alert and Oriented, No Acute Distress HEENT: Pupils equal, round, reactive to light. Conjunctivae clear.  External ears unremarkable.  Moist mucous membranes. Lungs: Clear and comfortable work of breathing, speaking in full sentences without accessory muscle use. Cardiac: Regular rate and rhythm.  Neuro: CN II-XII grossly intact, gait normal. Extremities: No peripheral edema.  Strong peripheral pulses.  Mental Status: Logical thought process, no agitation no anxiety there is  mild depression with tearfulness Skin: Warm and dry.  Assessment & Plan: Ann Black was seen today for anxiety.  Diagnoses and associated orders for this visit:  Anxiety and depression - venlafaxine XR (EFFEXOR XR) 75 MG 24 hr capsule; One by mouth daily for one week then two daily thereafter.    Anxiety and depression is worsening discuss taper off of citalopram and taper up Effexor. Contracted for safety  25 minutes spent face-to-face during visit today of which at least 50% was counseling or coordinating care regarding anxiety and depression.   Return in about 4 weeks (around 01/24/2013).

## 2013-02-05 ENCOUNTER — Ambulatory Visit: Payer: Self-pay | Admitting: Family Medicine

## 2013-02-06 ENCOUNTER — Encounter: Payer: Self-pay | Admitting: Family Medicine

## 2013-02-06 ENCOUNTER — Ambulatory Visit (INDEPENDENT_AMBULATORY_CARE_PROVIDER_SITE_OTHER): Payer: Private Health Insurance - Indemnity | Admitting: Family Medicine

## 2013-02-06 VITALS — BP 117/79 | HR 63 | Wt 118.0 lb

## 2013-02-06 DIAGNOSIS — F32A Depression, unspecified: Secondary | ICD-10-CM

## 2013-02-06 DIAGNOSIS — F329 Major depressive disorder, single episode, unspecified: Secondary | ICD-10-CM

## 2013-02-06 DIAGNOSIS — F341 Dysthymic disorder: Secondary | ICD-10-CM

## 2013-02-06 MED ORDER — VENLAFAXINE HCL ER 75 MG PO CP24: ORAL_CAPSULE | ORAL | Status: DC

## 2013-02-06 NOTE — Progress Notes (Signed)
CC: Ann Black is a 22 y.o. female is here for anxiety and depression   Subjective: HPI:  Followup anxiety and depression: Since I saw her last she tapered off citalopram and started on Effexor. She reports soon after starting Effexor she had large improvement of anxiety and subjective depression. She is quite happy with her current state of mental health these days. Her only complaint is fatigue in the morning when trying to get out of bed however this is not interfering with quality of life. She denies paranoia, sleep disturbance, hallucinations, nor any other mental disturbance.  She's gotten back together with her boyfriend, this has also helped her sense of well-being.   Review Of Systems Outlined In HPI  Past Medical History  Diagnosis Date  . 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 Hyperlipidemia Father   . Hypertension Father      History  Substance Use Topics  . Smoking status: Never Smoker   . Smokeless tobacco: Never Used  . Alcohol Use: 0.0 oz/week    0 drink(s) per week     Comment: 1-2 x  a month     Objective: Filed Vitals:   02/06/13 1118  BP: 117/79  Pulse: 63    Vital signs reviewed. General: Alert and Oriented, No Acute Distress HEENT: Pupils equal, round, reactive to light. Conjunctivae clear.  External ears unremarkable.  Moist mucous membranes. Lungs: Clear and comfortable work of breathing, speaking in full sentences without accessory muscle use. Cardiac: Regular rate and rhythm.  Neuro: CN II-XII grossly intact, gait normal. Extremities: No peripheral edema.  Strong peripheral pulses.  Mental Status: No depression, anxiety, nor agitation. Logical though process. Skin: Warm and dry.  Assessment & Plan: Ann Black was seen today for anxiety and depression.  Diagnoses and associated orders for this visit:  Anxiety and depression - Discontinue: venlafaxine XR (EFFEXOR XR)  75 MG 24 hr capsule; Two by mouth daily. - venlafaxine XR (EFFEXOR XR) 75 MG 24 hr capsule; Two by mouth daily.    Anxiety and depression: Improved and controlled continue Effexor. Return for any deterioration otherwise return for followup in 6 months  Return in about 6 months (around 08/07/2013).

## 2013-02-12 ENCOUNTER — Ambulatory Visit (INDEPENDENT_AMBULATORY_CARE_PROVIDER_SITE_OTHER): Payer: Private Health Insurance - Indemnity | Admitting: Family Medicine

## 2013-02-12 ENCOUNTER — Encounter: Payer: Self-pay | Admitting: Family Medicine

## 2013-02-12 VITALS — BP 117/84 | HR 91 | Wt 117.0 lb

## 2013-02-12 DIAGNOSIS — F32A Depression, unspecified: Secondary | ICD-10-CM

## 2013-02-12 DIAGNOSIS — F341 Dysthymic disorder: Secondary | ICD-10-CM

## 2013-02-12 DIAGNOSIS — F329 Major depressive disorder, single episode, unspecified: Secondary | ICD-10-CM

## 2013-02-12 NOTE — Progress Notes (Signed)
CC: Ann Black is a 22 y.o. female is here for Feeling Depressed   Subjective: HPI:  Follow up depression: Symptoms have been mild to absent since starting Effexor however she ran out of this medication on Thursday was unable to get it refilled until this morning. She reports that severe depression developed sometime around Saturday and was worsening on a daily basis. She describes this as gloom, poor outlook on life, and thoughts of negativity but denies feelings of wanting to harm herself or others.  Symptoms have been worsened by poor health of her grandfather. She was able to refill her medication and took it this morning. She's unsure whether or not she's feeling better or worse this morning. Denies any physical complaints today   Review Of Systems Outlined In HPI  Past Medical History  Diagnosis Date  . 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 Hyperlipidemia Father   . Hypertension Father      History  Substance Use Topics  . Smoking status: Never Smoker   . Smokeless tobacco: Never Used  . Alcohol Use: 0.0 oz/week    0 drink(s) per week     Comment: 1-2 x  a month     Objective: Filed Vitals:   02/12/13 1025  BP: 117/84  Pulse: 91    Vital signs reviewed. General: Alert and Oriented, No Acute Distress HEENT: Pupils equal, round, reactive to light. Conjunctivae clear.  External ears unremarkable.  Moist mucous membranes. Lungs: Clear and comfortable work of breathing, speaking in full sentences without accessory muscle use. Cardiac: Regular rate and rhythm.  Neuro: CN II-XII grossly intact, gait normal. Extremities: No peripheral edema.  Strong peripheral pulses.  Mental Status: Mild depression without, anxiety, nor agitation. Logical though process. Occasional tearfulness Skin: Warm and dry.  Assessment & Plan: Ann Black was seen today for feeling depressed.  Diagnoses and associated  orders for this visit:  Anxiety and depression    Highly suspicious that her acute deterioration was from abruptly stopping Effexor, I'm glad she already restarted her prescription, continue to take a daily basis. She was given a hotline number for behavioral health if she ever has crisis after-hours  Return if symptoms worsen or fail to improve.

## 2013-03-27 ENCOUNTER — Encounter: Payer: Self-pay | Admitting: Family Medicine

## 2013-03-27 ENCOUNTER — Ambulatory Visit (INDEPENDENT_AMBULATORY_CARE_PROVIDER_SITE_OTHER): Payer: Private Health Insurance - Indemnity | Admitting: Family Medicine

## 2013-03-27 VITALS — BP 120/81 | HR 65 | Wt 120.0 lb

## 2013-03-27 DIAGNOSIS — F329 Major depressive disorder, single episode, unspecified: Secondary | ICD-10-CM

## 2013-03-27 DIAGNOSIS — F32A Depression, unspecified: Secondary | ICD-10-CM

## 2013-03-27 DIAGNOSIS — B9689 Other specified bacterial agents as the cause of diseases classified elsewhere: Secondary | ICD-10-CM

## 2013-03-27 DIAGNOSIS — A499 Bacterial infection, unspecified: Secondary | ICD-10-CM

## 2013-03-27 DIAGNOSIS — F341 Dysthymic disorder: Secondary | ICD-10-CM

## 2013-03-27 DIAGNOSIS — Z13220 Encounter for screening for lipoid disorders: Secondary | ICD-10-CM

## 2013-03-27 DIAGNOSIS — J329 Chronic sinusitis, unspecified: Secondary | ICD-10-CM

## 2013-03-27 DIAGNOSIS — F419 Anxiety disorder, unspecified: Principal | ICD-10-CM

## 2013-03-27 MED ORDER — DOXYCYCLINE HYCLATE 100 MG PO TABS: ORAL_TABLET | ORAL | Status: AC

## 2013-03-27 NOTE — Progress Notes (Signed)
CC: Ann Black is a 23 y.o. female is here for cholesterol check   Subjective: HPI:  Patient is requesting lipid screening she tells me that his computer attention that her father and literally everybody on his side of the family has severely high total cholesterol she believes she's been told that it ranges typically between 300-400 prior to her family member starting on statin medications and fish oils. She has never had her cholesterol checked. She denies right upper quadrant pain, chest pains, limb claudication, or epigastric pain. She is not a smoker she has no cardiac history  She complains that she's had nasal congestion for the past week-week and half symptoms were getting better late last week however the accompanying facial pressure in her forehead  Seems to have rebounded for the worse over the weekend. Accompanied by subjective postnasal drip, fatigue, no benefit from Advil no other interventions. Denies fevers, chills, shortness of breath or cough symptoms are worse in the evening or when lying down  When she was seen last for anxiety and depression she had a significant decline in her state of health due to running out of Effexor. She tells me that 2-3 days after restarting Effexor anxiety and depression were nonexistent and no longer interfering with her quality of life. She's happy with her current state of lack of anxiety and depression without known side effects from Effexor  Review Of Systems Outlined In HPI  Past Medical History  Diagnosis Date  . 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 Hyperlipidemia Father   . Hypertension Father   . Hyperlipidemia Paternal Uncle      History  Substance Use Topics  . Smoking status: Never Smoker   . Smokeless tobacco: Never Used  . Alcohol Use: 0.0 oz/week    0 drink(s) per week     Comment: 1-2 x  a month     Objective: Filed Vitals:   03/27/13 0857   BP: 120/81  Pulse: 65    General: Alert and Oriented, No Acute Distress HEENT: Pupils equal, round, reactive to light. Conjunctivae clear.  External ears unremarkable, canals clear with intact TMs with appropriate landmarks.  Middle ear appears open without effusion. Boggy erythematous inferior turbinates.  Moist mucous membranes, pharynx without inflammation nor lesions however mild to moderate cobblestoning.  Neck supple without palpable lymphadenopathy nor abnormal masses. Lungs: Clear to auscultation bilaterally, no wheezing/ronchi/rales.  Comfortable work of breathing. Good air movement. Extremities: No peripheral edema.  Strong peripheral pulses.  Mental Status: No depression, anxiety, nor agitation. Skin: Warm and dry.  Assessment & Plan: Ann Black was seen today for cholesterol check.  Diagnoses and associated orders for this visit:  Anxiety and depression - doxycycline (VIBRA-TABS) 100 MG tablet; One by mouth twice a day for ten days.  Lipid screening - Lipid Profile  Bacterial sinusitis    Anxiety and depression: Controlled continue Effexor please disregard doxycycline linked above Lipid screening for routine dyslipidemia screening and strong family history of dyslipidemia Bacterial sinusitis: Start doxycycline consider Alka-Seltzer cold and sinus for symptoms  Return if symptoms worsen or fail to improve.

## 2013-03-28 ENCOUNTER — Encounter: Payer: Self-pay | Admitting: Family Medicine

## 2013-03-28 DIAGNOSIS — E785 Hyperlipidemia, unspecified: Secondary | ICD-10-CM | POA: Insufficient documentation

## 2013-03-28 LAB — LIPID PANEL
CHOLESTEROL: 214 mg/dL — AB (ref 0–200)
HDL: 65 mg/dL (ref >39)
LDL CALC: 130 mg/dL — AB (ref 0–99)
TRIGLYCERIDES: 93 mg/dL (ref <150)
Total CHOL/HDL Ratio: 3.3 Ratio
VLDL: 19 mg/dL (ref 0–40)

## 2013-05-04 ENCOUNTER — Encounter: Payer: Self-pay | Admitting: Emergency Medicine

## 2013-05-04 ENCOUNTER — Emergency Department
Admission: EM | Admit: 2013-05-04 | Discharge: 2013-05-04 | Disposition: A | Discharge status: Home / Self Care | Payer: Private Health Insurance - Indemnity | Source: Home / Self Care | Attending: Emergency Medicine | Admitting: Emergency Medicine

## 2013-05-04 DIAGNOSIS — E860 Dehydration: Secondary | ICD-10-CM

## 2013-05-04 DIAGNOSIS — K529 Noninfective gastroenteritis and colitis, unspecified: Secondary | ICD-10-CM

## 2013-05-04 DIAGNOSIS — R197 Diarrhea, unspecified: Secondary | ICD-10-CM

## 2013-05-04 DIAGNOSIS — R110 Nausea: Secondary | ICD-10-CM

## 2013-05-04 DIAGNOSIS — K5289 Other specified noninfective gastroenteritis and colitis: Secondary | ICD-10-CM

## 2013-05-04 LAB — POCT CBC W AUTO DIFF (K'VILLE URGENT CARE)

## 2013-05-04 MED ORDER — SODIUM CHLORIDE 0.9 % IV BOLUS (SEPSIS)
1000.0000 mL | Freq: Once | INTRAVENOUS | Status: AC
  Administered 2013-05-04: 1000 mL via INTRAVENOUS

## 2013-05-04 MED ORDER — PROMETHAZINE HCL 25 MG/ML IJ SOLN
25.0000 mg | Freq: Once | INTRAMUSCULAR | Status: AC
  Administered 2013-05-04: 25 mg via INTRAVENOUS

## 2013-05-04 NOTE — ED Provider Notes (Signed)
CSN: [id number]     Arrival date & time 05/04/13  1243 History   First MD Initiated Contact with Patient 05/04/13 1311     Chief Complaint  Patient presents with  . Diarrhea   (Consider location/radiation/quality/duration/timing/severity/associated sxs/prior Treatment) HPI Previously healthy 23 year old white female here with her mom today complaining of continued diarrhea headache and nausea since Tuesday.  The following day on Wednesday she went to the emergency room and they gave her 2 bags of fluids.  She's also been taking some Phenergan which has helped.  Did throw up once yesterday but her main symptoms are diarrhea that she describes as watery and nonbloody.  No recent travel or other sick contacts or eating any strange foods or spoiled or raw foods.  She does have a history of H. pylori in a few years ago which was treated successfully.  Also had a history of C. difficile last fall after taking some antibiotics.  She's not taken any antibiotics recently.  No fever chills.  Minimal abdominal discomfort and no cramping.  She's been trying to drink some water and Gatorade and eat some of the bland diet but does not feel like she is keeping up with it.   Past Medical History  Diagnosis Date  . 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 No pertinent past surgical history. Family History  Problem Relation Age of Onset  . Hyperlipidemia Father   . Hypertension Father   . Hyperlipidemia Paternal Uncle    History  Substance Use Topics  . Smoking status: Never Smoker   . Smokeless tobacco: Never Used  . Alcohol Use: 0.0 oz/week    0 drink(s) per week     Comment: 1-2 x  a month   OB History   Grav Para Term Preterm Abortions TAB SAB Ect Mult Living                 Review of Systems  All other systems reviewed and are negative.    Allergies  Amoxicillin  Home Medications   Current Outpatient Rx  Name  Route  Sig  Dispense  Refill  .  promethazine (PHENERGAN) 25 MG tablet   Oral   Take 25 mg by mouth every 6 (six) hours as needed for nausea or vomiting.         . AMBULATORY NON FORMULARY MEDICATION   Oral   Take 1 tablet by mouth daily. Medication Name: Probiotic one tablet daily for one week   7 tablet   0   . desogestrel-ethinyl estradiol (APRI,EMOQUETTE,SOLIA) 0.15-30 MG-MCG tablet   Oral   Take 1 tablet by mouth daily.         [name]. venlafaxine XR (EFFEXOR XR) 75 MG 24 hr capsule      Two by mouth daily.   60 capsule   5    BP 110/78  Pulse 103  Temp(Src) 98 F (36.7 C) (Oral)  Ht 5' (1.524 m)  Wt 120 lb (54.432 kg)  BMI 23.44 kg/m2  SpO2 96% Physical Exam  Nursing note and vitals reviewed. Constitutional: She is oriented to person, place, and time. Vital signs are normal. She appears well-developed and well-nourished. She is cooperative.  Non-toxic appearance. She does not appear ill.  HENT:  Head: Normocephalic and atraumatic.  Right Ear: Tympanic membrane, external ear and ear canal normal.  Left Ear: Tympanic membrane, external ear and ear canal normal.  Nose: Nose normal.  Mouth/Throat: Uvula is midline and oropharynx  is clear and moist.  Eyes: No scleral icterus.  Neck: Neck supple.  Cardiovascular: Regular rhythm and normal heart sounds.   Pulmonary/Chest: Effort normal and breath sounds normal. No respiratory distress. She has no decreased breath sounds. She has no wheezes.  Abdominal: Soft. Normal appearance. There is tenderness (mild epigastric and periumbilical). There is no rigidity, no rebound, no guarding, no CVA tenderness, no tenderness at McBurney's point and negative Murphy's sign.  Neurological: She is alert and oriented to person, place, and time.  Skin: Skin is warm and dry.  Psychiatric: She has a normal mood and affect. Her speech is normal and behavior is normal.    ED Course  Procedures (including critical care time) Labs Review Labs Reviewed  POCT CBC W AUTO DIFF  ([facility])   Imaging Review No results found.   MDM   1. Noninfectious gastroenteritis and colitis   2. Diarrhea    Due to patient's orthostatics, I do believe that she is somewhat dehydrated.  While she is here in clinic, I'm to check a CBC (normal) and then give her 1 L of normal saline.  After 1 liter, she is feeling "normal" and much better than before.  I do not believe that she has any bacterial causes of diarrhea as it is likely viral.  I told her that she can take some over-the-counter Imodium to slow things down a little bit but her body is trying to get rid of whatever is inside.  So I'm not going to give her a prescription for Lomotil yet.  Also needs to water-down the Gatorade she's been drinking or drink Pedialyte and/or water.  He can continue the Phenergan which has been helping her.  Encourage bland diet (nothing spicy, greasy, fried, or acidic), hydration, and rest.  If worsening of pain, nausea, or other symptoms, advised to contact your PCP.  Her symptoms should pass within the next couple days.  If not she may need to come back or followup with her PCP to discuss some further tests.   Marlaine Hind, MD 05/04/13 [phone]

## 2013-05-04 NOTE — ED Notes (Signed)
Diarrhea, headache, nausea since Tuesday. Had IV fluids in ED on Wednesday morning, was given phenegren has helped

## 2013-06-19 ENCOUNTER — Encounter: Payer: Self-pay | Admitting: *Deleted

## 2013-06-25 ENCOUNTER — Ambulatory Visit (INDEPENDENT_AMBULATORY_CARE_PROVIDER_SITE_OTHER): Payer: Private Health Insurance - Indemnity | Admitting: Family Medicine

## 2013-06-25 ENCOUNTER — Encounter: Payer: Self-pay | Admitting: Family Medicine

## 2013-06-25 VITALS — BP 119/80 | HR 76 | Wt 119.0 lb

## 2013-06-25 DIAGNOSIS — B9689 Other specified bacterial agents as the cause of diseases classified elsewhere: Secondary | ICD-10-CM

## 2013-06-25 DIAGNOSIS — J329 Chronic sinusitis, unspecified: Secondary | ICD-10-CM

## 2013-06-25 DIAGNOSIS — A499 Bacterial infection, unspecified: Secondary | ICD-10-CM

## 2013-06-25 DIAGNOSIS — R1013 Epigastric pain: Secondary | ICD-10-CM

## 2013-06-25 MED ORDER — OMEPRAZOLE 40 MG PO CPDR: DELAYED_RELEASE_CAPSULE | ORAL | Status: DC

## 2013-06-25 MED ORDER — CEFDINIR 300 MG PO CAPS: 300.0000 mg | ORAL_CAPSULE | Freq: Two times a day (BID) | ORAL | Status: AC

## 2013-06-25 NOTE — Patient Instructions (Signed)
You can start the Nexium samples however a similar generic version (omeprazole) was sent to your pharmacy.  You can take either one, once a day, to help reduce your GERD symptoms but there is no need to take both.

## 2013-06-25 NOTE — Progress Notes (Signed)
CC: Ann Black is a 23 y.o. female is here for abdominal issues   Subjective: HPI:  Complains of abdominal pain that has been present for the past one to 2 months on a daily basis. Described as a moderate sensation of abdominal bloating and epigastric discomfort. Symptoms are identical to what she experienced years ago when she was found to have an H. pylori infection after endoscopy biopsies.  Symptoms are mild-to-moderate in severity worse after eating large meals. Nothing particularly makes better or worse other than above. Pain is nonradiating and described only has pain. Has not been accompanied by any exertional pain, nausea, vomiting, diarrhea, constipation, blood in stool, nor awaken in because of pain. Denies unintentional weight loss. No interventions as of yet  Complains of facial pressure localized to the forehead that has been slowly improving after an acute onset 2 weeks ago however has rebounded to now moderate in severity over the past 2 days. Accompanied by nasal congestion, nonproductive cough, subjective postnasal drip. No interventions as of yet. Denies fevers, chills, wheezing, shortness of breath  Review Of Systems Outlined In HPI  Past Medical History  Diagnosis Date  . 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 Helicobacter pylori (H. pylori)     past hystory( did have endoscopy)    No past surgical history on file. Family History  Problem Relation Age of Onset  . Hyperlipidemia Father   . Hypertension Father   . Hyperlipidemia Paternal Uncle     History   Social History  . Marital Status: Single    Spouse Name: N/A    Number of Children: N/A  . Years of Education: N/A   Occupational History  . Not on file.   Social History Main Topics  . Smoking status: Never Smoker   . Smokeless tobacco: Never Used  . Alcohol Use: 0.0 oz/week    0 drink(s) per week     Comment: 1-2 x  a month  . Drug Use: No  . Sexual Activity: Yes    Birth  Control/ Protection: Pill   Other Topics Concern  . Not on file   Social History Narrative  . No narrative on file     Objective: BP 119/80  Pulse 76  Wt 119 lb (53.978 kg)  General: Alert and Oriented, No Acute Distress HEENT: Pupils equal, round, reactive to light. Conjunctivae clear.  External ears unremarkable, canals clear with intact TMs with appropriate landmarks.  Middle ear appears open without effusion. Pink inferior turbinates.  Moist mucous membranes, pharynx without inflammation nor lesions however moderate cobblestoning.  Neck supple without palpable lymphadenopathy nor abnormal masses. Lungs: Clear to auscultation bilaterally, no wheezing/ronchi/rales.  Comfortable work of breathing. Good air movement. Cardiac: Regular rate and rhythm. Normal S1/S2.  No murmurs, rubs, nor gallops.   Abdomen: Normal bowel sounds, no palpable masses, no rebound or guarding. Abdominal discomfort is reproduced with palpation in the epigastric region. Extremities: No peripheral edema.  Strong peripheral pulses.  Mental Status: No depression, anxiety, nor agitation. Skin: Warm and dry.  Assessment & Plan: [name] was seen today for abdominal issues.  Diagnoses and associated orders for this visit:  Bacterial sinusitis - cefdinir (OMNICEF) 300 MG capsule; Take 1 capsule (300 mg total) by mouth 2 (two) times daily.  Abdominal pain, epigastric - Cancel: H. pylori antigen, stool - H. pylori breath test  Other Orders - omeprazole (PRILOSEC) 40 MG capsule; One by mouth daily at least one hour before a meal.  Bacterial sinusitis: Start Omnicef Abdominal pain: Checking for H Pylori with breath test, after samples collected I've encouraged her to start omeprazole on a daily basis pending results of above.  Return if symptoms worsen or fail to improve.

## 2013-06-26 LAB — H. PYLORI BREATH TEST: H. pylori Breath Test: NOT DETECTED

## 2013-07-03 ENCOUNTER — Telehealth: Payer: Self-pay | Admitting: *Deleted

## 2013-07-03 NOTE — Telephone Encounter (Signed)
Pt called and states she was taking nexium but then she started to have abd cramping. She thought the nexium was causing the cramping and so that's why she stopped it. Since stopping the nexium pt still has the cramping feeling. She says it feels like a "period cramp" but not as severe. Pt also c/o of still feeling full. I advised for her to continue taking the nexium and keep note of when the cramping occurs. It it persists and/or gets worse then f/u with [facility]. Advised to also f/u if any new sxs develop or any changes in urine output or flow.pt agreed and voiced understanding

## 2013-07-19 ENCOUNTER — Telehealth: Payer: Self-pay | Admitting: *Deleted

## 2013-07-19 DIAGNOSIS — R197 Diarrhea, unspecified: Secondary | ICD-10-CM

## 2013-07-19 NOTE — Telephone Encounter (Signed)
Pt called and statses she is still having diarrhea. Pt states the nexium has helped with reflux  But she is still burping a lot and still has stomach discomfort

## 2013-07-20 NOTE — Telephone Encounter (Signed)
Pt called back;returned call and pt notified

## 2013-07-20 NOTE — Telephone Encounter (Signed)
Tried calling patient twice and phone goes silent but no one answers

## 2013-07-20 NOTE — Telephone Encounter (Signed)
I've placed a GI referral for further workup.  Also if she can visit our lab with a C. Diff test that I've printed off it'd be wise to rule this out given her history.

## 2013-08-03 ENCOUNTER — Emergency Department
Admission: EM | Admit: 2013-08-03 | Discharge: 2013-08-03 | Disposition: A | Discharge status: Home / Self Care | Payer: Private Health Insurance - Indemnity | Source: Home / Self Care | Attending: Family Medicine | Admitting: Family Medicine

## 2013-08-03 ENCOUNTER — Encounter: Payer: Self-pay | Admitting: Emergency Medicine

## 2013-08-03 DIAGNOSIS — R197 Diarrhea, unspecified: Secondary | ICD-10-CM

## 2013-08-03 DIAGNOSIS — A0472 Enterocolitis due to Clostridium difficile, not specified as recurrent: Secondary | ICD-10-CM

## 2013-08-03 LAB — POCT CBC W AUTO DIFF (K'VILLE URGENT CARE)

## 2013-08-03 MED ORDER — PROMETHAZINE HCL 25 MG PO TABS
25.0000 mg | ORAL_TABLET | Freq: Four times a day (QID) | ORAL | Status: DC | PRN
Start: 2013-08-03 — End: 2013-11-09

## 2013-08-03 NOTE — ED Provider Notes (Addendum)
CSN: [id number]     Arrival date & time 08/03/13  1533 History   First MD Initiated Contact with Patient 08/03/13 1559     Chief Complaint  Patient presents with  . Diarrhea      HPI Comments: Patient complains of onset of abdominal cramping and watery stools three days ago.  She has been fatigued and has had night sweats for about 4 days.  She has had nausea but no vomiting.  No hematochezia.  No recent antibiotic use.  Denies recent foreign travel, or drinking untreated water in a wilderness environment.   She has a past history of C. Difficile colitis after taking amoxicillin.  Patient is a 23 y.o. female presenting with diarrhea. The history is provided by the patient and a parent.  Diarrhea Quality:  Watery Severity:  Moderate Onset quality:  Sudden Duration:  4 days Timing:  Intermittent Progression:  Improving Relieved by:  Nothing Worsened by:  Nothing tried Ineffective treatments:  None tried Associated symptoms: abdominal pain and chills   Associated symptoms: no arthralgias, no recent cough, no diaphoresis, no fever, no headaches, no myalgias, no URI and no vomiting   Risk factors: no recent antibiotic use, no sick contacts, no suspicious food intake and no travel to endemic areas     Past Medical History  Diagnosis Date  . 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 Helicobacter pylori (H. pylori)     past hystory( did have endoscopy)   History reviewed. No pertinent past surgical history. Family History  Problem Relation Age of Onset  . Hyperlipidemia Father   . Hypertension Father   . Hyperlipidemia Paternal Uncle    History  Substance Use Topics  . Smoking status: Never Smoker   . Smokeless tobacco: Never Used  . Alcohol Use: 0.0 oz/week    0 drink(s) per week     Comment: 1-2 x  a month   OB History   Grav Para Term Preterm Abortions TAB SAB Ect Mult Living                 Review of Systems  Constitutional: Positive for chills.  Negative for fever and diaphoresis.  Gastrointestinal: Positive for abdominal pain and diarrhea. Negative for vomiting.  Musculoskeletal: Negative for arthralgias and myalgias.  Neurological: Negative for headaches.  All other systems reviewed and are negative.   Allergies  Amoxicillin  Home Medications   Prior to Admission medications   Medication Sig Start Date End Date Taking? Authorizing Provider  AMBULATORY NON FORMULARY MEDICATION Take 1 tablet by mouth daily. Medication Name: Probiotic one tablet daily for one week 10/13/12   Laren BoomSean Hommel, DO  desogestrel-ethinyl estradiol (APRI,EMOQUETTE,SOLIA) 0.15-30 MG-MCG tablet Take 1 tablet by mouth daily.    Historical Provider, MD  omeprazole (PRILOSEC) 40 MG capsule One by mouth daily at least one hour before a meal. 06/25/13 06/25/14  Sean Hommel, DO  promethazine (PHENERGAN) 25 MG tablet Take 1 tablet (25 mg total) by mouth every 6 (six) hours as needed for nausea or vomiting. 08/03/13   Lattie HawStephen A Beese, MD  venlafaxine XR (EFFEXOR XR) 75 MG 24 hr capsule Two by mouth daily. 02/06/13 02/06/14  Sean Hommel, DO   BP 132/91  Pulse 97  Temp(Src) 97.9 F (36.6 C) (Oral)  Ht 5' (1.524 m)  Wt 121 lb (54.885 kg)  BMI 23.63 kg/m2  SpO2 98% Physical Exam Nursing notes and Vital Signs reviewed. Appearance:  Patient appears healthy, stated age, and in no  acute distress Eyes:  Pupils are equal, round, and reactive to light and accomodation.  Extraocular movement is intact.  Conjunctivae are not inflamed  Ears:  Canals normal.  Tympanic membranes normal.  Nose:  Normal turbinates.  No sinus tenderness.   Pharynx:  Normal; moist mucous membranes  Neck:  Supple.  No adenopathy. Lungs:  Clear to auscultation.  Breath sounds are equal.  Heart:  Regular rate and rhythm without murmurs, rubs, or gallops.  Abdomen:  Nontender without masses or hepatosplenomegaly.  Bowel sounds are present.  No CVA or flank tenderness.  Extremities:  No edema.  No calf  tenderness Skin:  No rash present.   ED Course  Procedures  none    Labs Reviewed  CLOSTRIDIUM DIFFICILE BY PCR  STOOL CULTURE POCT CBC:  WBC 6.9; LY 34.8; MO 3.6; GR 61.6; Hgb 14.1; Platelets 199          MDM   1. History of C. difficile diarrhea.  Suspect present illness viral gastroenteritis.  Normal WBC reassuring.  2. Diarrhea    C. Diff toxin assay and culture pending.  Treat symptomatically for now:  Rx for Phenergan PO Begin clear liquids (Pedialyte while having diarrhea) until improved, then advance to a [employer] (Bananas, Rice, Applesauce, Toast).  Then gradually resume a regular diet when tolerated.  Avoid milk products until well.  To decrease diarrhea, mix one heaping tablespoon Citrucel (methylcellulose) in 8 oz water and drink one to three times daily.  When stools become more formed, may take Imodium (loperamide) once or twice daily to decrease stool frequency.  If symptoms become significantly worse during the night or over the weekend, proceed to the local emergency room.    Lattie Haw, MD 08/06/13 [id number]  Lattie Haw, MD 08/06/13 [phone]

## 2013-08-03 NOTE — ED Notes (Signed)
Diarrhea x 4 days hx C-Diff

## 2013-08-03 NOTE — Discharge Instructions (Signed)
Begin clear liquids (Pedialyte while having diarrhea) until improved, then advance to a [employer] (Bananas, Rice, Applesauce, Toast).  Then gradually resume a regular diet when tolerated.  Avoid milk products until well.  To decrease diarrhea, mix one heaping tablespoon Citrucel (methylcellulose) in 8 oz water and drink one to three times daily.  When stools become more formed, may take Imodium (loperamide) once or twice daily to decrease stool frequency.  If symptoms become significantly worse during the night or over the weekend, proceed to the local emergency room.    Clostridium Difficile Infection Clostridium difficile (C. difficile) is a bacteria found in the intestinal tract or colon. Under certain conditions, it causes diarrhea and sometimes severe disease. The severe form of the disease is known as pseudomembranous colitis (often called C. difficile colitis). This disease can damage the lining of the colon or cause the colon to become enlarged (toxic megacolon).  CAUSES  Your colon normally contains many different bacteria, including C. difficile. The balance of bacteria in your colon can change during illness. This is especially true when you take antibiotic medicine. Taking antibiotics may allow the C. difficile to grow, multiply excessively, and make a toxin that then causes illness. The elderly and people with certain medical conditions have a greater risk of getting C. difficile infections. SYMPTOMS   Watery diarrhea.  Fever.  Fatigue.  Loss of appetite.  Nausea.  Abdominal swelling, pain, or tenderness.  Dehydration. DIAGNOSIS  Your symptoms may make your caregiver suspicious of a C. difficile infection, especially if you have used antibiotics in the preceding weeks. However, there are only 2 ways to know for certain whether you have a C. difficile infection:  A lab test that finds the toxin in your stool.  The specific appearance of an abnormality (pseudomembrane) in your  colon. This can only be seen by doing a sigmoidoscopy or colonoscopy. These procedures involve passing an instrument through your rectum to look at the inside of your colon. Your caregiver will help determine if these tests are necessary. TREATMENT   Most people are successfully treated with one of two specific antibiotics, usually given by mouth. Other antibiotics you are receiving are stopped if possible.  Intravenous (IV) fluids and correction of electrolyte imbalance may be necessary.  Rarely, surgery may be needed to remove the infected part of the intestines.  Careful hand washing by you and your caregivers is important to prevent the spread of infection. In the hospital, your caregivers may also put on gowns and gloves to prevent the spread of the C. difficile bacteria. Your room is also cleaned regularly with a solution containing bleach or a product that is known to kill C. difficile. HOME CARE INSTRUCTIONS  Drink enough fluids to keep your urine clear or pale yellow. Avoid milk, caffeine, and alcohol.  Ask your caregiver for specific rehydration instructions.  Try eating small, frequent meals rather than large meals.  Take your antibiotics as directed. Finish them even if you start to feel better.  Do not use medicines to slow diarrhea. This could delay healing or cause complications.  Wash your hands thoroughly after using the bathroom and before preparing food.  Make sure people who live with you wash their hands often, too.  Carefully disinfect all surfaces with a product that contains chlorine bleach. SEEK MEDICAL CARE IF:  Diarrhea persists longer than expected or recurs after completing your course of antibiotic treatment for the C. difficile infection.  You have trouble staying hydrated. SEEK IMMEDIATE  MEDICAL CARE IF:  You develop a new fever.  You have increasing abdominal pain or tenderness.  There is blood in your stools, or your stools are dark black and  tarry.  You cannot hold down food or liquids. MAKE SURE YOU:   Understand these instructions.  Will watch your condition.  Will get help right away if you are not doing well or get worse. Document Released: 12/02/2004 Document Revised: 06/19/2012 Document Reviewed: 07/31/2010 [facility] Patient Information 2014 [place].

## 2013-08-04 LAB — CLOSTRIDIUM DIFFICILE BY PCR: Toxigenic C. Difficile by PCR: NOT DETECTED

## 2013-08-06 ENCOUNTER — Telehealth: Payer: Self-pay | Admitting: Emergency Medicine

## 2013-08-07 ENCOUNTER — Telehealth: Payer: Self-pay | Admitting: *Deleted

## 2013-08-07 LAB — STOOL CULTURE

## 2013-08-29 ENCOUNTER — Encounter: Payer: Self-pay | Admitting: Family Medicine

## 2013-08-31 ENCOUNTER — Encounter: Payer: Self-pay | Admitting: Family Medicine

## 2013-08-31 DIAGNOSIS — R197 Diarrhea, unspecified: Secondary | ICD-10-CM | POA: Insufficient documentation

## 2013-11-09 ENCOUNTER — Encounter: Payer: Self-pay | Admitting: Physician Assistant

## 2013-11-09 ENCOUNTER — Ambulatory Visit (INDEPENDENT_AMBULATORY_CARE_PROVIDER_SITE_OTHER): Payer: Private Health Insurance - Indemnity | Admitting: Physician Assistant

## 2013-11-09 VITALS — BP 143/89 | HR 103 | Ht 60.0 in | Wt 127.0 lb

## 2013-11-09 DIAGNOSIS — K219 Gastro-esophageal reflux disease without esophagitis: Secondary | ICD-10-CM

## 2013-11-09 DIAGNOSIS — R110 Nausea: Secondary | ICD-10-CM

## 2013-11-09 LAB — POCT URINALYSIS DIPSTICK
BILIRUBIN UA: NEGATIVE
GLUCOSE UA: NEGATIVE
Ketones, UA: NEGATIVE
LEUKOCYTES UA: NEGATIVE
NITRITE UA: NEGATIVE
Protein, UA: NEGATIVE
RBC UA: NEGATIVE
Spec Grav, UA: 1.015
Urobilinogen, UA: 0.2
pH, UA: 7

## 2013-11-09 MED ORDER — PROMETHAZINE HCL 25 MG PO TABS: 25.0000 mg | ORAL_TABLET | Freq: Four times a day (QID) | ORAL | Status: DC | PRN

## 2013-11-09 MED ORDER — OMEPRAZOLE 40 MG PO CPDR: DELAYED_RELEASE_CAPSULE | ORAL | Status: DC

## 2013-11-09 NOTE — Progress Notes (Signed)
   Subjective:    Patient ID: Ann Black, female    DOB: 04/18/1990, 23 y.o.   MRN: [id number]  HPI Pt presents to the clinic with 3 weeks of nausea. No vomiting, fever, diarrhea, constipation, urinary symptoms. No bowel changes or blood in stool. She has had some upper abdominal tenderness but nothing bad. She has taken multiple home pregnancy test and negative. Last sexual intercourse with in early July. She has hx of h.pylori and c.diff. She admits she ran out of prilosec about 1 month ago and has not been taking anything. No acid reflux symptoms.     Review of Systems  All other systems reviewed and are negative.      Objective:   Physical Exam  Constitutional: She is oriented to person, place, and time. She appears well-developed and well-nourished.  HENT:  Head: Normocephalic and atraumatic.  Cardiovascular: Regular rhythm and normal heart sounds.   Tachycardia at 103.   Pulmonary/Chest: Effort normal and breath sounds normal. She has no wheezes.  No CVA tenderness.   Abdominal: Soft. Bowel sounds are normal. She exhibits no distension.  Generalized tenderness over epigastric area and over upper abdomen.   No guarding or rebound.  Negative murphys sign. No lower abdominal tenderness.   Neurological: She is alert and oriented to person, place, and time.  Skin: Skin is dry.  Psychiatric: She has a normal mood and affect. Her behavior is normal.          Assessment & Plan:  Nausea/GERD- will get serum pregnancy. Ordered cbc and cmp. Gave phenergan  refills for nausea. Start back on prilosec. i feel like acid could be building up in stomach and causing nausea with gastritis.keep to GERD diet this weekend.   UA ordered. .. Results for orders placed in visit on 11/09/13  POCT URINALYSIS DIPSTICK      Result Value Ref Range   Color, UA yellow     Clarity, UA slightly cloudy     Glucose, UA neg     Bilirubin, UA neg     Ketones, UA neg     Spec Grav, UA 1.015     Blood, UA neg     pH, UA 7.0     Protein, UA neg     Urobilinogen, UA 0.2     Nitrite, UA neg     Leukocytes, UA Negative     No UTI. Follow up with worsening symptoms.

## 2013-11-09 NOTE — Patient Instructions (Signed)
Start back on prilosec daily.

## 2013-11-10 LAB — CBC WITH DIFFERENTIAL/PLATELET
Basophils Absolute: 0.1 10*3/uL (ref 0.0–0.1)
Basophils Relative: 1 % (ref 0–1)
EOS PCT: 2 % (ref 0–5)
Eosinophils Absolute: 0.1 10*3/uL (ref 0.0–0.7)
HEMATOCRIT: 40.3 % (ref 36.0–46.0)
HEMOGLOBIN: 13.8 g/dL (ref 12.0–15.0)
LYMPHS ABS: 2.2 10*3/uL (ref 0.7–4.0)
LYMPHS PCT: 34 % (ref 12–46)
MCH: 30.9 pg (ref 26.0–34.0)
MCHC: 34.2 g/dL (ref 30.0–36.0)
MCV: 90.2 fL (ref 78.0–100.0)
Monocytes Absolute: 0.6 10*3/uL (ref 0.1–1.0)
Monocytes Relative: 10 % (ref 3–12)
Neutro Abs: 3.4 10*3/uL (ref 1.7–7.7)
Neutrophils Relative %: 53 % (ref 43–77)
PLATELETS: 215 10*3/uL (ref 150–400)
RBC: 4.47 MIL/uL (ref 3.87–5.11)
RDW: 13.5 % (ref 11.5–15.5)
WBC: 6.4 10*3/uL (ref 4.0–10.5)

## 2013-11-10 LAB — COMPLETE METABOLIC PANEL WITH GFR
ALK PHOS: 51 U/L (ref 39–117)
ALT: 12 U/L (ref 0–35)
AST: 15 U/L (ref 0–37)
Albumin: 4.4 g/dL (ref 3.5–5.2)
BILIRUBIN TOTAL: 0.5 mg/dL (ref 0.2–1.2)
BUN: 11 mg/dL (ref 6–23)
CALCIUM: 9.8 mg/dL (ref 8.4–10.5)
CO2: 27 mEq/L (ref 19–32)
CREATININE: 0.7 mg/dL (ref 0.50–1.10)
Chloride: 103 mEq/L (ref 96–112)
GFR, Est African American: >89 mL/min
GFR, Est Non African American: >89 mL/min
Glucose, Bld: 86 mg/dL (ref 70–99)
Potassium: 3.9 mEq/L (ref 3.5–5.3)
Sodium: 137 mEq/L (ref 135–145)
Total Protein: 7.2 g/dL (ref 6.0–8.3)

## 2013-11-10 LAB — HCG, SERUM, QUALITATIVE: Preg, Serum: NEGATIVE

## 2014-01-29 ENCOUNTER — Telehealth: Payer: Self-pay | Admitting: *Deleted

## 2014-01-29 ENCOUNTER — Ambulatory Visit: Payer: Self-pay | Admitting: Family Medicine

## 2014-01-29 NOTE — Telephone Encounter (Signed)
Pt is going back to school and wants a letter stating that she is and has been under the care of a doctor for anxiety and depression and the dates she started the medication

## 2014-03-29 ENCOUNTER — Ambulatory Visit: Payer: Self-pay | Admitting: Family Medicine

## 2014-04-01 ENCOUNTER — Telehealth: Payer: Self-pay | Admitting: Family Medicine

## 2014-04-01 MED ORDER — VENLAFAXINE HCL ER 150 MG PO CP24: 150.0000 mg | ORAL_CAPSULE | Freq: Every day | ORAL | Status: DC

## 2014-04-01 NOTE — Telephone Encounter (Signed)
Follow up required for future effexor refills.

## 2014-04-05 ENCOUNTER — Ambulatory Visit (INDEPENDENT_AMBULATORY_CARE_PROVIDER_SITE_OTHER): Payer: Private Health Insurance - Indemnity | Admitting: Family Medicine

## 2014-04-05 ENCOUNTER — Encounter: Payer: Self-pay | Admitting: Family Medicine

## 2014-04-05 VITALS — BP 140/90 | HR 103 | Wt 129.0 lb

## 2014-04-05 DIAGNOSIS — F32A Depression, unspecified: Secondary | ICD-10-CM

## 2014-04-05 DIAGNOSIS — F329 Major depressive disorder, single episode, unspecified: Secondary | ICD-10-CM

## 2014-04-05 DIAGNOSIS — F418 Other specified anxiety disorders: Secondary | ICD-10-CM

## 2014-04-05 DIAGNOSIS — F419 Anxiety disorder, unspecified: Principal | ICD-10-CM

## 2014-04-05 MED ORDER — VENLAFAXINE HCL ER 37.5 MG PO CP24: ORAL_CAPSULE | ORAL | Status: DC

## 2014-04-05 NOTE — Progress Notes (Signed)
CC: Ann Black is a 24 y.o. female is here for f/u anxiety and depression   Subjective: HPI:  Follow-up anxiety and depression: she has been taking 150 mg of Effexor on a daily basis since I saw her last. She tells me she feelsgreat from anxiety and depression standpoint. She is happy with her job, her academics, and looking forward to her boyfriend returning home from deployment in June. She is worried she might be getting a side effect from this medication, she has no libido whatsoever and when they do have sex she is unable to climax. She is in love with her boyfriend and still attracted to him but just has no libido. Symptoms were never present until she started Effexor. She denies any mental disturbance. She denies any other genitourinary complaints. She wonders if there's any  Weight to help with this. There is been no unintentional weight loss or gain, sleep disturbance, nor thoughts went on herself or others   Review Of Systems Outlined In HPI  Past Medical History  Diagnosis Date  . 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 Helicobacter pylori (H. pylori)     past hystory( did have endoscopy)    No past surgical history on file. Family History  Problem Relation Age of Onset  . Hyperlipidemia Father   . Hypertension Father   . Hyperlipidemia Paternal Uncle     History   Social History  . Marital Status: Single    Spouse Name: N/A    Number of Children: N/A  . Years of Education: N/A   Occupational History  . Not on file.   Social History Main Topics  . Smoking status: Never Smoker   . Smokeless tobacco: Never Used  . Alcohol Use: 0.0 oz/week    0 drink(s) per week     Comment: 1-2 x  a month  . Drug Use: No  . Sexual Activity: Yes    Birth Control/ Protection: Pill   Other Topics Concern  . Not on file   Social History Narrative     Objective: BP 140/90 mmHg  Pulse 103  Wt 129 lb (58.514 kg)  Review of Systems - General ROS:  negative for - chills, fever, night sweats, weight gain or weight loss Ophthalmic ROS: negative for - decreased vision Psychological ROS: negative for - anxiety or depression ENT ROS: negative for - hearing change, nasal congestion, tinnitus or allergies Hematological and Lymphatic ROS: negative for - bleeding problems, bruising or swollen lymph nodes Breast ROS: negative Respiratory ROS: no cough, shortness of breath, or wheezing Cardiovascular ROS: no chest pain or dyspnea on exertion Gastrointestinal ROS: no abdominal pain, change in bowel habits, or black or bloody stools Genito-Urinary ROS: negative for - genital discharge, genital ulcers, incontinence or abnormal bleeding from genitals Musculoskeletal ROS: negative for - joint pain or muscle pain Neurological ROS: negative for - headaches or memory loss Dermatological ROS: negative for lumps, mole changes, rash and skin lesion changes  Assessment & Plan: Ann Black was seen today for f/u anxiety and depression.  Diagnoses and associated orders for this visit:  Anxiety and depression - venlafaxine XR (EFFEXOR XR) 37.5 MG 24 hr capsule; Three by mouth at once daily for a week, then two by mouth daily for a week, then one by mouth daily for a week.    Anxiety and depression: ControlledHowever she's experiencing intolerable sexual side effects. We discussed switching to another medication or tapering down on the medication. Joint decision to fully  tapered down over a long extended taper and if she has any return of depression or anxiety we will switch Viibrid which has a much lower incidence of sexual side effects. She can call me if she needs to make this switch. I've already provided her with a savings voucher and 30 day coupon.   Return if symptoms worsen or fail to improve.

## 2014-04-19 ENCOUNTER — Encounter: Payer: Self-pay | Admitting: Family Medicine

## 2014-04-19 ENCOUNTER — Ambulatory Visit (INDEPENDENT_AMBULATORY_CARE_PROVIDER_SITE_OTHER): Payer: Private Health Insurance - Indemnity | Admitting: Family Medicine

## 2014-04-19 VITALS — BP 127/91 | HR 69 | Ht 62.0 in | Wt 132.0 lb

## 2014-04-19 DIAGNOSIS — R809 Proteinuria, unspecified: Secondary | ICD-10-CM | POA: Insufficient documentation

## 2014-04-19 DIAGNOSIS — Z0000 Encounter for general adult medical examination without abnormal findings: Secondary | ICD-10-CM

## 2014-04-19 LAB — POCT URINALYSIS DIPSTICK
Bilirubin, UA: NEGATIVE
GLUCOSE UA: NEGATIVE
Ketones, UA: NEGATIVE
Leukocytes, UA: NEGATIVE
NITRITE UA: NEGATIVE
SPEC GRAV UA: 1.02
UROBILINOGEN UA: 0.2
pH, UA: 7

## 2014-04-19 LAB — POCT UA - MICROALBUMIN

## 2014-04-19 LAB — POCT HEMOGLOBIN: HEMOGLOBIN: 14.2 g/dL (ref 12.2–16.2)

## 2014-04-19 NOTE — Progress Notes (Signed)
CC: Ann Black is a 24 y.o. female is here for Annual Exam   Subjective: HPI:  Colonoscopy: She had one done for a diarrhea workup, no family history of colon cancer, will need repeat at age 41 Papsmear: Jan 01 2014, repeat 3 year Mammogram: No family history of breast cancer, will begin screening at age 27  Influenza Vaccine: Up-to-date Pneumovax: No current indication Td/Tdap: Up-to-date until 2017 Zoster: (Start 25 yo)  Presents for complete physical exam and filling out paperwork for [employer] program   Review of Systems - General ROS: negative for - chills, fever, night sweats, weight gain or weight loss Ophthalmic ROS: negative for - decreased vision Psychological ROS: negative for - anxiety or depression ENT ROS: negative for - hearing change, nasal congestion, tinnitus or allergies Hematological and Lymphatic ROS: negative for - bleeding problems, bruising or swollen lymph nodes Breast ROS: negative Respiratory ROS: no cough, shortness of breath, or wheezing Cardiovascular ROS: no chest pain or dyspnea on exertion Gastrointestinal ROS: no abdominal pain, change in bowel habits, or black or bloody stools Genito-Urinary ROS: negative for - genital discharge, genital ulcers, incontinence or abnormal bleeding from genitals Musculoskeletal ROS: negative for - joint pain or muscle pain Neurological ROS: negative for - headaches or memory loss Dermatological ROS: negative for lumps, mole changes, rash and skin lesion changes  Past Medical History  Diagnosis Date  . 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 Helicobacter pylori (H. pylori)     past hystory( did have endoscopy)    No past surgical history on file. Family History  Problem Relation Age of Onset  . Hyperlipidemia Father   . Hypertension Father   . Hyperlipidemia Paternal Uncle     History   Social History  . Marital Status: Single    Spouse Name: N/A  .  Number of Children: N/A  . Years of Education: N/A   Occupational History  . Not on file.   Social History Main Topics  . Smoking status: Never Smoker   . Smokeless tobacco: Never Used  . Alcohol Use: 0.0 oz/week    0 drink(s) per week     Comment: 1-2 x  a month  . Drug Use: No  . Sexual Activity: Yes    Birth Control/ Protection: Pill   Other Topics Concern  . Not on file   Social History Narrative     Objective: BP 127/91 mmHg  Pulse 69  Ht  (1.575 m)  Wt 132 lb (59.875 kg)  BMI 24.14 kg/m2  General: No Acute Distress HEENT: Atraumatic, normocephalic, conjunctivae normal without scleral icterus.  No nasal discharge, hearing grossly intact, TMs with good landmarks bilaterally with no middle ear abnormalities, posterior pharynx clear without oral lesions. Neck: Supple, trachea midline, no cervical nor supraclavicular adenopathy. Pulmonary: Clear to auscultation bilaterally without wheezing, rhonchi, nor rales. Cardiac: Regular rate and rhythm.  No murmurs, rubs, nor gallops. No peripheral edema.  2+ peripheral pulses bilaterally. Abdomen: Bowel sounds normal.  No masses.  Non-tender without rebound.  Negative Murphy's sign. GU: Deferred to her GYN office  MSK: Grossly intact, no signs of weakness.  Full strength throughout upper and lower extremities.  Full ROM in upper and lower extremities.  No midline spinal tenderness. Neuro: Gait unremarkable, CN II-XII grossly intact.  C5-C6 Reflex 2/4 Bilaterally, L4 Reflex 2/4 Bilaterally.  Cerebellar function intact. Skin: No rashes. Psych: Alert and oriented to person/place/time.  Thought process normal. No anxiety/depression.  Assessment & Plan: Ann Black was seen today for annual exam.  Diagnoses and all orders for this visit:  Annual physical exam Orders: -     Lipid panel -     Varicella zoster antibody, IgG -     CBC -     TSH -     POCT hemoglobin -     Urinalysis Dipstick -     POCT UA -  Microalbumin  Proteinuria   Healthy lifestyle interventions including but not limited to regular exercise, a healthy low fat diet, moderation of salt intake, the dangers of tobacco/alcohol/recreational drug use, nutrition supplementation, and accident avoidance were discussed with the patient and a handout was provided for future reference.  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 Urinalysis shows blood and protein, she is menstruating, I've asked her to come back for nurse visit to recheck a urinalysis in one week. She would like her thyroid checked for some unintentional weight gain which seems reasonable.   Return in about 1 week (around 04/26/2014) for Urinalysis nurse visit.

## 2014-04-23 ENCOUNTER — Ambulatory Visit: Payer: Self-pay

## 2014-04-25 ENCOUNTER — Ambulatory Visit: Payer: Self-pay

## 2014-04-28 ENCOUNTER — Other Ambulatory Visit: Payer: Self-pay | Admitting: Family Medicine

## 2014-04-30 ENCOUNTER — Ambulatory Visit: Payer: Private Health Insurance - Indemnity

## 2014-04-30 ENCOUNTER — Telehealth: Payer: Self-pay | Admitting: Family Medicine

## 2014-04-30 ENCOUNTER — Other Ambulatory Visit: Payer: Self-pay | Admitting: Family Medicine

## 2014-04-30 LAB — POCT URINALYSIS DIPSTICK
Bilirubin, UA: NEGATIVE
Blood, UA: NEGATIVE
GLUCOSE UA: NEGATIVE
KETONES UA: NEGATIVE
Leukocytes, UA: NEGATIVE
Nitrite, UA: NEGATIVE
Protein, UA: NEGATIVE
SPEC GRAV UA: 1.01
UROBILINOGEN UA: 0.2
pH, UA: 7

## 2014-04-30 NOTE — Addendum Note (Signed)
Addended by: Chalmers CaterUTTLE, Arianah Torgeson H on: 04/30/2014 04:55 PM   Modules accepted: Orders

## 2014-04-30 NOTE — Telephone Encounter (Signed)
[name], Will you please let patient know that her blood cell counts and thyroid function were normal.  Her cholesterol was moderately elevated which will put her at risk of heart attacks and strokes in the future if not addressed.  Given her lack of additional risk factors I don't think she needs to start medication yet but This can be improved with engaging in 30-45 minutes of moderate exercise most days of the week.   We'll want to recheck thi sin 6-12 months.  Her labs confirm that she has immunity to chicken pox, if I need to sign off on this for any of her school forms just drop them off anytime.

## 2014-05-01 ENCOUNTER — Other Ambulatory Visit: Payer: Self-pay

## 2014-05-01 DIAGNOSIS — F329 Major depressive disorder, single episode, unspecified: Secondary | ICD-10-CM

## 2014-05-01 DIAGNOSIS — F419 Anxiety disorder, unspecified: Principal | ICD-10-CM

## 2014-05-01 DIAGNOSIS — F32A Depression, unspecified: Secondary | ICD-10-CM

## 2014-05-01 MED ORDER — VENLAFAXINE HCL ER 37.5 MG PO CP24: ORAL_CAPSULE | ORAL | Status: DC

## 2014-05-01 NOTE — Telephone Encounter (Signed)
Pt.notified

## 2014-05-01 NOTE — Telephone Encounter (Signed)
Patient lost prescription. [facility] [place]

## 2014-05-10 ENCOUNTER — Telehealth: Payer: Self-pay | Admitting: *Deleted

## 2014-05-10 DIAGNOSIS — F329 Major depressive disorder, single episode, unspecified: Secondary | ICD-10-CM

## 2014-05-10 DIAGNOSIS — F32A Depression, unspecified: Secondary | ICD-10-CM

## 2014-05-10 MED ORDER — VILAZODONE HCL 10 & 20 MG PO KIT: 1.0000 | PACK | Freq: Every day | ORAL | Status: DC

## 2014-05-10 NOTE — Telephone Encounter (Signed)
Seth Bake, Coupon for free starter kit and Rx in your inbox, this can be left at [facility] if she needs it this weekend.  Although it is a SSRI it still works very similarly to [place].  I'd recommend she f/u with me sometime around the 21st day of dosing but well before she runs out of the 30 days in the starter kit.

## 2014-05-10 NOTE — Telephone Encounter (Signed)
Pt would like to try the Viibryd as discussed. She is wondering if she should be concerned about anything since the med she is on is an SNRI and other is SSRI. Please advise

## 2014-05-10 NOTE — Telephone Encounter (Signed)
vm not set up, rx at my desk with card

## 2014-05-13 NOTE — Telephone Encounter (Signed)
Pt notified and and rx up front

## 2014-05-26 ENCOUNTER — Other Ambulatory Visit: Payer: Self-pay | Admitting: Family Medicine

## 2014-06-14 ENCOUNTER — Ambulatory Visit: Payer: Self-pay | Admitting: Family Medicine

## 2014-06-18 ENCOUNTER — Encounter: Payer: Self-pay | Admitting: Family Medicine

## 2014-06-18 ENCOUNTER — Ambulatory Visit (INDEPENDENT_AMBULATORY_CARE_PROVIDER_SITE_OTHER): Payer: Private Health Insurance - Indemnity | Admitting: Family Medicine

## 2014-06-18 VITALS — BP 127/79 | HR 87 | Ht 63.0 in | Wt 140.0 lb

## 2014-06-18 DIAGNOSIS — F418 Other specified anxiety disorders: Secondary | ICD-10-CM | POA: Diagnosis not present

## 2014-06-18 DIAGNOSIS — F329 Major depressive disorder, single episode, unspecified: Secondary | ICD-10-CM

## 2014-06-18 DIAGNOSIS — F32A Depression, unspecified: Secondary | ICD-10-CM

## 2014-06-18 DIAGNOSIS — F419 Anxiety disorder, unspecified: Principal | ICD-10-CM

## 2014-06-18 MED ORDER — VILAZODONE HCL 20 MG PO TABS: 30.0000 mg | ORAL_TABLET | Freq: Every day | ORAL | Status: DC

## 2014-06-18 MED ORDER — VILAZODONE HCL 20 MG PO TABS
30.0000 mg | ORAL_TABLET | Freq: Every day | ORAL | Status: DC
Start: 2014-06-18 — End: 2015-01-20

## 2014-06-18 MED ORDER — VILAZODONE HCL 20 MG PO TABS
30.0000 mg | ORAL_TABLET | Freq: Every day | ORAL | Status: DC
Start: 2014-06-18 — End: 2014-12-21

## 2014-06-18 NOTE — Progress Notes (Signed)
CC: Ann Black is a 24 y.o. female is here for Follow-up   Subjective: HPI:  Follow-up anxiety and depression: She tells me that she felt nauseous and anxious while switching from Effexor to viibryd however after about a week of being on viibryd these symptoms resolved. She tells me that she's not dealing with any anxiety or depression and feels very normal lately and she is quite happy with the effect of viibryd. She tells me that she feels like she has a short fuse and is emotionally sensitive however this is how she's felt for the majority of her life before anxiety and depression began to plague her. She tells me that she feels like this is normal and she actually welcomes it. She denies any other new mental disturbance. No thoughts or Normal herself or others.  She works in [employer] office and had access to additional starter packs. She's currently taking 30 mg of viibryd on a daily basis and has not had to fill a new prescription yet due to samples.   Review Of Systems Outlined In HPI  Past Medical History  Diagnosis Date  . 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 Helicobacter pylori (H. pylori)     past hystory( did have endoscopy)    No past surgical history on file. Family History  Problem Relation Age of Onset  . Hyperlipidemia Father   . Hypertension Father   . Hyperlipidemia Paternal Uncle     History   Social History  . Marital Status: Single    Spouse Name: N/A  . Number of Children: N/A  . Years of Education: N/A   Occupational History  . Not on file.   Social History Main Topics  . Smoking status: Never Smoker   . Smokeless tobacco: Never Used  . Alcohol Use: 0.0 oz/week    0 drink(s) per week     Comment: 1-2 x  a month  . Drug Use: No  . Sexual Activity: Yes    Birth Control/ Protection: Pill   Other Topics Concern  . Not on file   Social History Narrative     Objective: BP 127/79 mmHg  Pulse 87  Ht  (1.6 m)  Wt 140 lb  (63.504 kg)  BMI 24.81 kg/m2  Vital signs reviewed. General: Alert and Oriented, No Acute Distress HEENT: Pupils equal, round, reactive to light. Conjunctivae clear.  External ears unremarkable.  Moist mucous membranes. Lungs: Clear and comfortable work of breathing, speaking in full sentences without accessory muscle use. Cardiac: Regular rate and rhythm.  Neuro: CN II-XII grossly intact, gait normal. Extremities: No peripheral edema.  Strong peripheral pulses.  Mental Status: No depression, anxiety, nor agitation. Logical though process. Skin: Warm and dry.  Assessment & Plan: Ann Black was seen today for follow-up.  Diagnoses and all orders for this visit:  Anxiety and depression  Other orders -     Discontinue: Vilazodone HCl (VIIBRYD) 20 MG TABS; Take 1.5 tablets (30 mg total) by mouth daily. Will use savings voucher. -     Discontinue: Vilazodone HCl (VIIBRYD) 20 MG TABS; Take 1.5 tablets (30 mg total) by mouth daily. Will use savings voucher. -     Vilazodone HCl (VIIBRYD) 20 MG TABS; Take 1.5 tablets (30 mg total) by mouth daily. Will use savings voucher.  Anxiety and depression: Controlled on 30 mg of viibryd daily. I've given her formal for prescription for 30 mg regimen that she can have filled out her convenience. I've asked  her to check on the price of this in the near future so that when it's time for her to rely on a formal prescription she will already be aware her financial obligation for continuing this medication. If all continues to go well no need to follow-up for another 3-6 months.  Return in about 6 months (around 12/18/2014).

## 2014-10-14 ENCOUNTER — Telehealth: Payer: Self-pay | Admitting: *Deleted

## 2014-10-14 NOTE — Telephone Encounter (Signed)
Pt called and states she thinks the pharmacy gave her only 20 pills of her vybriid and she takes 1 1/2 tabs a day. She states she tried to get it refilled and she got a message saying it was too soon to fill. Called the pharmacy and spoke to someone there who states the last time they filled it was 7/18 for # 45 which would reflect her taking the medication 1 1/2 tablets a day. She states she will call patient and check inventory to make sure they did not short her

## 2014-12-21 ENCOUNTER — Other Ambulatory Visit: Payer: Self-pay | Admitting: Family Medicine

## 2015-01-15 ENCOUNTER — Ambulatory Visit: Payer: Self-pay | Admitting: Family Medicine

## 2015-01-19 ENCOUNTER — Other Ambulatory Visit: Payer: Self-pay | Admitting: Family Medicine

## 2015-01-20 ENCOUNTER — Ambulatory Visit (INDEPENDENT_AMBULATORY_CARE_PROVIDER_SITE_OTHER): Payer: Private Health Insurance - Indemnity | Admitting: Family Medicine

## 2015-01-20 ENCOUNTER — Encounter: Payer: Self-pay | Admitting: Family Medicine

## 2015-01-20 VITALS — BP 126/86 | HR 70 | Wt 137.0 lb

## 2015-01-20 DIAGNOSIS — F419 Anxiety disorder, unspecified: Principal | ICD-10-CM

## 2015-01-20 DIAGNOSIS — F418 Other specified anxiety disorders: Secondary | ICD-10-CM

## 2015-01-20 DIAGNOSIS — R5383 Other fatigue: Secondary | ICD-10-CM

## 2015-01-20 DIAGNOSIS — N6320 Unspecified lump in the left breast, unspecified quadrant: Secondary | ICD-10-CM | POA: Insufficient documentation

## 2015-01-20 DIAGNOSIS — F329 Major depressive disorder, single episode, unspecified: Secondary | ICD-10-CM

## 2015-01-20 DIAGNOSIS — F32A Depression, unspecified: Secondary | ICD-10-CM

## 2015-01-20 MED ORDER — VILAZODONE HCL 20 MG PO TABS: 1.5000 | ORAL_TABLET | Freq: Every day | ORAL | Status: DC

## 2015-01-20 NOTE — Progress Notes (Signed)
CC: Ann Black is a 24 y.o. female is here for Medication Refill   Subjective: HPI:  Follow-up anxiety and depression: She is taking Viibryd on a daily basis without known side effects. She tells me she does not feel like she's expressing any element of anxiety nor depression. She is quite happy with this medication. She is requesting a 90 day refill. Denies thoughts of going to harm self or others. She does report some fatigue on a daily basis mild in severity. She also reports nonrestorative sleep but she was having an element of this before starting the antidepressant and it actually improved a little bit while taking the antidepressant but now has plateaued. She denies snoring or apneic episodes. She denies weakness. Her hemoglobin was checked at work recently and it was 14   Review Of Systems Outlined In HPI  Past Medical History  Diagnosis Date  . 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 Helicobacter pylori (H. pylori)     past hystory( did have endoscopy)    No past surgical history on file. Family History  Problem Relation Age of Onset  . Hyperlipidemia Father   . Hypertension Father   . Hyperlipidemia Paternal Uncle     Social History   Social History  . Marital Status: Single    Spouse Name: N/A  . Number of Children: N/A  . Years of Education: N/A   Occupational History  . Not on file.   Social History Main Topics  . Smoking status: Never Smoker   . Smokeless tobacco: Never Used  . Alcohol Use: 0.0 oz/week    0 drink(s) per week     Comment: 1-2 x  a month  . Drug Use: No  . Sexual Activity: Yes    Birth Control/ Protection: Pill   Other Topics Concern  . Not on file   Social History Narrative     Objective: BP 126/86 mmHg  Pulse 70  Wt 137 lb (62.143 kg)  Vital signs reviewed. General: Alert and Oriented, No Acute Distress HEENT: Pupils equal, round, reactive to light. Conjunctivae clear.  External ears unremarkable.  Moist  mucous membranes. Lungs: Clear and comfortable work of breathing, speaking in full sentences without accessory muscle use. Cardiac: Regular rate and rhythm.  Neuro: CN II-XII grossly intact, gait normal. Extremities: No peripheral edema.  Strong peripheral pulses.  Mental Status: No depression, anxiety, nor agitation. Logical though process. Skin: Warm and dry.  Assessment & Plan: Ann Black was seen today for medication refill.  Diagnoses and all orders for this visit:  Anxiety and depression -     Vilazodone HCl (VIIBRYD) 20 MG TABS; Take 1.5 tablets (30 mg total) by mouth daily.  Other fatigue -     VITAMIN D 25 Hydroxy (Vit-D Deficiency, Fractures)   Anxiety and depression: Controlled continue Viibryd Fatigue: Rule out vitamin D deficiency.  Return in about 6 months (around 07/20/2015).

## 2015-01-21 LAB — VITAMIN D 25 HYDROXY (VIT D DEFICIENCY, FRACTURES): VIT D 25 HYDROXY: 33 ng/mL (ref 30–100)

## 2015-04-04 ENCOUNTER — Ambulatory Visit: Payer: Self-pay

## 2015-04-18 ENCOUNTER — Ambulatory Visit: Payer: Self-pay

## 2015-08-08 ENCOUNTER — Ambulatory Visit (INDEPENDENT_AMBULATORY_CARE_PROVIDER_SITE_OTHER): Payer: Private Health Insurance - Indemnity | Admitting: Sports Medicine

## 2015-08-08 ENCOUNTER — Encounter: Payer: Self-pay | Admitting: Family Medicine

## 2015-08-08 ENCOUNTER — Other Ambulatory Visit: Payer: Self-pay | Admitting: Sports Medicine

## 2015-08-08 ENCOUNTER — Ambulatory Visit (INDEPENDENT_AMBULATORY_CARE_PROVIDER_SITE_OTHER): Payer: Private Health Insurance - Indemnity

## 2015-08-08 ENCOUNTER — Ambulatory Visit (INDEPENDENT_AMBULATORY_CARE_PROVIDER_SITE_OTHER): Payer: Private Health Insurance - Indemnity | Admitting: Family Medicine

## 2015-08-08 VITALS — BP 117/80 | HR 69 | Wt 133.0 lb

## 2015-08-08 DIAGNOSIS — S66911S Strain of unspecified muscle, fascia and tendon at wrist and hand level, right hand, sequela: Secondary | ICD-10-CM | POA: Diagnosis not present

## 2015-08-08 DIAGNOSIS — M25532 Pain in left wrist: Secondary | ICD-10-CM | POA: Diagnosis not present

## 2015-08-08 DIAGNOSIS — M67472 Ganglion, left ankle and foot: Secondary | ICD-10-CM

## 2015-08-08 DIAGNOSIS — S66919A Strain of unspecified muscle, fascia and tendon at wrist and hand level, unspecified hand, initial encounter: Secondary | ICD-10-CM | POA: Insufficient documentation

## 2015-08-08 DIAGNOSIS — N200 Calculus of kidney: Secondary | ICD-10-CM | POA: Diagnosis not present

## 2015-08-08 NOTE — Assessment & Plan Note (Signed)
Initially suspected ganglion cyst however I'm able to visualize the cut end of the tendon. She did have an injury around the same time where a plate cut into her arm. No injection provided, we are going to get an x-ray and MRI to confirm the diagnosis.

## 2015-08-08 NOTE — Progress Notes (Signed)
CC: Ann Black is a 25 y.o. female is here for Ganglion Cyst   Subjective: HPI:  Back in March she was carrying plates and fell and one of the plates was lodged into her left volar wrist she was seen at a local emergency room and they used Dermabond to reapproximate the laceration. Since then she's had no pain at the site of the laceration but she's had a past that developed a couple days after the injury on the volar surface of the wrist. It's painful with extension and does not fluctuate in size. It's nonpainful to touch. She's tried her best to wait until allowed to go away on its own however it's not getting any better. She denies any overlying skin changes. She denies joint pain elsewhere.  She also was diagnosed with a kidney stone back in March and had to get a stent. It's been removed and she is staying well-hydrated without any blood in her urine or flank pain.   Review Of Systems Outlined In HPI  Past Medical History  Diagnosis Date  . 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 Helicobacter pylori (H. pylori)     past hystory( did have endoscopy)    No past surgical history on file. Family History  Problem Relation Age of Onset  . Hyperlipidemia Father   . Hypertension Father   . Hyperlipidemia Paternal Uncle     Social History   Social History  . Marital Status: Single    Spouse Name: N/A  . Number of Children: N/A  . Years of Education: N/A   Occupational History  . Not on file.   Social History Main Topics  . Smoking status: Never Smoker   . Smokeless tobacco: Never Used  . Alcohol Use: 0.0 oz/week    0 drink(s) per week     Comment: 1-2 x  a month  . Drug Use: No  . Sexual Activity: Yes    Birth Control/ Protection: Pill   Other Topics Concern  . Not on file   Social History Narrative     Objective: BP 117/80 mmHg  Pulse 69  Wt 133 lb (60.328 kg)  Vital signs reviewed. General: Alert and Oriented, No Acute Distress HEENT:  Pupils equal, round, reactive to light. Conjunctivae clear.  External ears unremarkable.  Moist mucous membranes. Lungs: Clear and comfortable work of breathing, speaking in full sentences without accessory muscle use. Cardiac: Regular rate and rhythm.  Neuro: CN II-XII grossly intact, gait normal. Extremities: No peripheral edema.  Strong peripheral pulses.  Mental Status: No depression, anxiety, nor agitation. Logical though process. Skin: Warm and dry. Slight puckering on the volar surface of the left wrist. Nontender ganglion cyst on the left volar wrist.  Assessment & Plan: Ann Black was seen today for ganglion cyst.  Diagnoses and all orders for this visit:  Nephrolith  Ganglion cyst of left foot   I discussed we could do a trial of compression for the next couple weeks to see if this helps reduce the swelling of the ganglia cyst also she has the option of getting it drained by one of my colleagues today. She would like to go to the path of possibly getting it drained by Dr. Karie Schwalbe. A referral has been placed for him to see her this morning. Nephrolithiasis: Currently asymptomatic, went over staying well-hydrated to reduce the risk of this returning again.  Return if symptoms worsen or fail to improve.

## 2015-08-08 NOTE — Progress Notes (Signed)
   Subjective:    I'm seeing this patient as a consultation for:   Dr. Laren BoomSean Hommel  CC: Left wrist nodule  HPI: For several months this pleasant 25 year old female has had a nodule that she localizes on the volar aspect of her left wrist, minimal pain. She also has a scar that is somewhat proximal to the nodule. She tells me that a plate broke and cut into her arm, she was seen in the emergency department, the incision was closed with Dermabond, and she was then lost to follow-up. She has not noted any difficulty with using her hand, but only has minimal pain. Symptoms are mild, persistent.  Past medical history, Surgical history, Family history not pertinant except as noted below, Social history, Allergies, and medications have been entered into the medical record, reviewed, and no changes needed.   Review of Systems: No headache, visual changes, nausea, vomiting, diarrhea, constipation, dizziness, abdominal pain, skin rash, fevers, chills, night sweats, weight loss, swollen lymph nodes, body aches, joint swelling, muscle aches, chest pain, shortness of breath, mood changes, visual or auditory hallucinations.   Objective:   General: Well Developed, well nourished, and in no acute distress.  Neuro/Psych: Alert and oriented x3, extra-ocular muscles intact, able to move all 4 extremities, sensation grossly intact. Skin: Warm and dry, no rashes noted.  Respiratory: Not using accessory muscles, speaking in full sentences, trachea midline.  Cardiovascular: Pulses palpable, no extremity edema. Abdomen: Does not appear distended. Left Wrist: Visible and palpable nodule in the location of the flexor carpi radialis. ROM smooth and normal with good flexion and extension and ulnar/radial deviation that is symmetrical with opposite wrist. Palpation is normal over metacarpals, navicular, lunate, and TFCC; tendons without tenderness/ swelling No snuffbox tenderness. No tenderness over [place]. Strength 5/5 in all directions without pain. Negative Finkelstein, tinel's and phalens. Negative Watson's test.  Procedure: Diagnostic Ultrasound of  left wrist Device: GE Logiq E  Findings: Initially planned ganglion cyst aspiration and injection however I did note a isoechoic structure with a sharp endpoint proximally in the location of the flexor carpi radialis. Images permanently stored and available for review in the ultrasound unit.  Impression: Suspect transection and rupture of the flexor carpi radialis.  Impression and Recommendations:   This case required medical decision making of moderate complexity.

## 2015-08-11 ENCOUNTER — Institutional Professional Consult (permissible substitution): Payer: Self-pay | Admitting: Sports Medicine

## 2015-08-15 ENCOUNTER — Ambulatory Visit (INDEPENDENT_AMBULATORY_CARE_PROVIDER_SITE_OTHER): Payer: Private Health Insurance - Indemnity | Admitting: Sports Medicine

## 2015-08-15 ENCOUNTER — Encounter: Payer: Self-pay | Admitting: Sports Medicine

## 2015-08-15 VITALS — BP 115/76 | HR 66 | Resp 16 | Wt 132.0 lb

## 2015-08-15 DIAGNOSIS — L905 Scar conditions and fibrosis of skin: Secondary | ICD-10-CM

## 2015-08-15 NOTE — Progress Notes (Signed)
  Procedure:  Revision of left forearm scar Risks, benefits, and alternatives explained and consent obtained. Time out conducted. Surface prepped with alcohol. 5cc lidocaine with epinephine infiltrated in a field block. Adequate anesthesia ensured. Area prepped and draped in a sterile fashion. Excision performed with:using a #15 blade I made an elliptical incision down to the subcutanous tissues completely excising the scar.Then I undermined the edges of the wound to decrease tension, and then placed a single running subcuticular 4-0 Vicryl suture to close incision and approximate edges well. Hemostasis achieved. Pt stable.   I spent 40 minutes with this patient, greater than 50% was face-to-face time counseling regarding the above diagnoses

## 2015-08-15 NOTE — Assessment & Plan Note (Signed)
Scar revision as above. Return to see me in one week to recheck wound.

## 2015-08-18 ENCOUNTER — Other Ambulatory Visit: Payer: Self-pay

## 2015-08-20 ENCOUNTER — Telehealth: Payer: Self-pay

## 2015-08-20 MED ORDER — DOXYCYCLINE HYCLATE 100 MG PO TABS: 100.0000 mg | ORAL_TABLET | Freq: Two times a day (BID) | ORAL | Status: AC

## 2015-08-20 NOTE — Telephone Encounter (Signed)
Yes they started Tuesday, there is a chain of lymph nodes per her MD.

## 2015-08-20 NOTE — Telephone Encounter (Signed)
Sounds like it could be lymphangitis, any tender nodules in the armpit? Also going to add doxycycline.

## 2015-08-21 ENCOUNTER — Ambulatory Visit (INDEPENDENT_AMBULATORY_CARE_PROVIDER_SITE_OTHER): Payer: Private Health Insurance - Indemnity | Admitting: Sports Medicine

## 2015-08-21 DIAGNOSIS — L905 Scar conditions and fibrosis of skin: Secondary | ICD-10-CM | POA: Diagnosis not present

## 2015-08-21 NOTE — Assessment & Plan Note (Signed)
Doing extremely well post scar revision, a bit of Dermabond was applied, she can return to see me on an as-needed basis. Still awaiting MRI perspective flexor carpi radialis rupture.

## 2015-08-21 NOTE — Progress Notes (Signed)
  Subjective:    CC: follow-up  HPI: This is a pleasant 25 year old female, she returns for follow-up of a wound revision we did a week ago, the wound looks great, she did have some lymphangitis, we started doxycycline and this has since resolved. Overall doing well. Has not yet had her MRI.  Past medical history, Surgical history, Family history not pertinant except as noted below, Social history, Allergies, and medications have been entered into the medical record, reviewed, and no changes needed.   Review of Systems: No fevers, chills, night sweats, weight loss, chest pain, or shortness of breath.   Objective:    General: Well Developed, well nourished, and in no acute distress.  Neuro: Alert and oriented x3, extra-ocular muscles intact, sensation grossly intact.  HEENT: Normocephalic, atraumatic, pupils equal round reactive to light, neck supple, no masses, no lymphadenopathy, thyroid nonpalpable.  Skin: Warm and dry, no rashes. Cardiac: Regular rate and rhythm, no murmurs rubs or gallops, no lower extremity edema.  Respiratory: Clear to auscultation bilaterally. Not using accessory muscles, speaking in full sentences. Left wrist: Wound is clean, dry, intact, well approximated, Dermabond applied.  Impression and Recommendations:

## 2015-10-06 ENCOUNTER — Other Ambulatory Visit: Payer: Self-pay | Admitting: Family Medicine

## 2015-10-06 DIAGNOSIS — F419 Anxiety disorder, unspecified: Principal | ICD-10-CM

## 2015-10-06 DIAGNOSIS — F32A Depression, unspecified: Secondary | ICD-10-CM

## 2015-10-06 DIAGNOSIS — F329 Major depressive disorder, single episode, unspecified: Secondary | ICD-10-CM

## 2015-10-17 ENCOUNTER — Encounter: Payer: Self-pay | Admitting: Family Medicine

## 2015-10-17 ENCOUNTER — Ambulatory Visit (INDEPENDENT_AMBULATORY_CARE_PROVIDER_SITE_OTHER): Payer: Private Health Insurance - Indemnity | Admitting: Family Medicine

## 2015-10-17 DIAGNOSIS — F418 Other specified anxiety disorders: Secondary | ICD-10-CM | POA: Diagnosis not present

## 2015-10-17 DIAGNOSIS — F329 Major depressive disorder, single episode, unspecified: Secondary | ICD-10-CM

## 2015-10-17 DIAGNOSIS — F32A Depression, unspecified: Secondary | ICD-10-CM

## 2015-10-17 DIAGNOSIS — F419 Anxiety disorder, unspecified: Principal | ICD-10-CM

## 2015-10-17 MED ORDER — VILAZODONE HCL 20 MG PO TABS: 2.0000 | ORAL_TABLET | Freq: Every day | ORAL | 2 refills | Status: DC

## 2015-10-17 NOTE — Progress Notes (Signed)
CC: Ann Black is a 25 y.o. female is here for Anxiety; Depression; and Medication Refill   Subjective: HPI:  Follow-up anxiety and depression: She's taking Viibryd with 100% compliance. She denies any known side effects. Since I saw her last she feels like she's bringing home stress from work. He'll be days that she comes home and all she can think about his stress that happened at work. It interfere with her quality of life and has become an unwelcome distraction. She denies any other anxiety or depression or any other mental disturbance.    Review Of Systems Outlined In HPI  Past Medical History:  Diagnosis Date  . 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 Helicobacter pylori (H. pylori)    past hystory( did have endoscopy)    No past surgical history on file. Family History  Problem Relation Age of Onset  . Hyperlipidemia Father   . Hypertension Father   . Hyperlipidemia Paternal Uncle     Social History   Social History  . Marital status: Single    Spouse name: N/A  . Number of children: N/A  . Years of education: N/A   Occupational History  . Not on file.   Social History Main Topics  . Smoking status: Never Smoker  . Smokeless tobacco: Never Used  . Alcohol use 0.0 oz/week    0 drink(s) per week     Comment: 1-2 x  a month  . Drug use: No  . Sexual activity: Yes    Birth control/ protection: Pill   Other Topics Concern  . Not on file   Social History Narrative  . No narrative on file     Objective: BP 111/74   Pulse (!) 58   Wt 137 lb (62.1 kg)   BMI 24.27 kg/m   Vital signs reviewed. General: Alert and Oriented, No Acute Distress HEENT: Pupils equal, round, reactive to light. Conjunctivae clear.  External ears unremarkable.  Moist mucous membranes. Lungs: Clear and comfortable work of breathing, speaking in full sentences without accessory muscle use. Cardiac: Regular rate and rhythm.  Neuro: CN II-XII grossly intact, gait  normal. Extremities: No peripheral edema.  Strong peripheral pulses.  Mental Status: No depression, anxiety, nor agitation. Logical though process. Skin: Warm and dry.  Assessment & Plan: Ann Black was seen today for anxiety, depression and medication refill.  Diagnoses and all orders for this visit:  Anxiety and depression -     Vilazodone HCl (VIIBRYD) 20 MG TABS; Take 2 tablets (40 mg total) by mouth daily.   Anxiety depression: Questionably controlled anxiety, increasing Viibryd and I've asked her to call me at the end of the month whether or not she believes it's helping. If not she is okay with accepting her chief complaint is just part of her personality. If it does help we'll need to give her a 90 day supply of the 40 mg daily.  [facility]   Return if symptoms worsen or fail to improve.

## 2016-01-14 ENCOUNTER — Encounter: Payer: Self-pay | Admitting: Emergency Medicine

## 2016-01-14 ENCOUNTER — Emergency Department
Admission: EM | Admit: 2016-01-14 | Discharge: 2016-01-14 | Disposition: A | Discharge status: Home / Self Care | Payer: Managed Care, Other (non HMO) | Source: Home / Self Care | Attending: Family Medicine | Admitting: Family Medicine

## 2016-01-14 ENCOUNTER — Emergency Department (INDEPENDENT_AMBULATORY_CARE_PROVIDER_SITE_OTHER): Payer: Managed Care, Other (non HMO)

## 2016-01-14 DIAGNOSIS — R05 Cough: Secondary | ICD-10-CM

## 2016-01-14 DIAGNOSIS — R0989 Other specified symptoms and signs involving the circulatory and respiratory systems: Secondary | ICD-10-CM

## 2016-01-14 DIAGNOSIS — R6889 Other general symptoms and signs: Secondary | ICD-10-CM | POA: Diagnosis not present

## 2016-01-14 LAB — POCT INFLUENZA A/B
Influenza A, POC: NEGATIVE
Influenza B, POC: NEGATIVE

## 2016-01-14 MED ORDER — FLUTICASONE PROPIONATE 50 MCG/ACT NA SUSP: 2.0000 | Freq: Every day | NASAL | 2 refills | Status: DC

## 2016-01-14 MED ORDER — IBUPROFEN 600 MG PO TABS
600.0000 mg | ORAL_TABLET | Freq: Once | ORAL | Status: AC
  Administered 2016-01-14: 600 mg via ORAL

## 2016-01-14 MED ORDER — BENZONATATE 100 MG PO CAPS: 100.0000 mg | ORAL_CAPSULE | Freq: Three times a day (TID) | ORAL | 0 refills | Status: DC

## 2016-01-14 NOTE — ED Provider Notes (Signed)
CSN: [id number]     Arrival date & time 01/14/16  1154 History   First MD Initiated Contact with Patient 01/14/16 1218     Chief Complaint  Patient presents with  . Sinus Problem   (Consider location/radiation/quality/duration/timing/severity/associated sxs/prior Treatment) HPI Ann Black is a 25 y.o. female presenting to UC with c/o body aches, chills, mild to moderately productive cough with green sputum, sinus congestion with frontal headache and mild sore throat from cough.  She has taken Mucinex and Nyquil with minimal relief. Denies hx of asthma but is concerned she may be developing pneumonia or may have the flu.  She typically gets the flu vaccine through work but put it off this season and has not had it yet.  Denies n/v/d.     Past Medical History:  Diagnosis Date  . 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 Helicobacter pylori (H. pylori)    past hystory( did have endoscopy)   History reviewed. No pertinent surgical history. Family History  Problem Relation Age of Onset  . Hyperlipidemia Father   . Hypertension Father   . Hyperlipidemia Paternal Uncle    Social History  Substance Use Topics  . Smoking status: Never Smoker  . Smokeless tobacco: Never Used  . Alcohol use 0.0 oz/week     Comment: 1-2 x  a month   OB History    No data available     Review of Systems  Constitutional: Negative for chills and fever.  HENT: Positive for congestion, rhinorrhea, sinus pain, sinus pressure and sore throat. Negative for ear pain, trouble swallowing and voice change.   Respiratory: Positive for cough. Negative for shortness of breath.   Cardiovascular: Negative for chest pain and palpitations.  Gastrointestinal: Negative for abdominal pain, diarrhea, nausea and vomiting.  Musculoskeletal: Negative for arthralgias, back pain and myalgias.  Skin: Negative for rash.  Neurological: Positive for headaches. Negative for dizziness and light-headedness.     Allergies  Amoxicillin and Azithromycin  Home Medications   Prior to Admission medications   Medication Sig Start Date End Date Taking? Authorizing Provider  dextromethorphan-guaiFENesin (MUCINEX DM) 30-600 MG 12hr tablet Take 1 tablet by mouth 2 (two) times daily.   Yes Historical Provider, MD  DM-Doxylamine-Acetaminophen (NYQUIL COLD & FLU PO) Take by mouth.   Yes Historical Provider, MD  benzonatate (TESSALON) 100 MG capsule Take 1-2 capsules (100-200 mg total) by mouth every 8 (eight) hours. 01/14/16   Junius FinnerErin O'Malley, PA-C  desogestrel-ethinyl estradiol (APRI,EMOQUETTE,SOLIA) 0.15-30 MG-MCG tablet Take 1 tablet by mouth daily.    Historical Provider, MD  fluticasone (FLONASE) 50 MCG/ACT nasal spray Place 2 sprays into both nostrils daily. 01/14/16   Junius FinnerErin O'Malley, PA-C  Vilazodone HCl (VIIBRYD) 20 MG TABS Take 2 tablets (40 mg total) by mouth daily. 10/17/15   Laren BoomSean Hommel, DO   Meds Ordered and Administered this Visit   Medications  ibuprofen (ADVIL,MOTRIN) tablet 600 mg (600 mg Oral Given 01/14/16 1236)    BP 127/90 (BP Location: Left Arm)   Pulse 109   Temp 98.3 F (36.8 C) (Oral)   Ht 5' (1.524 m)   Wt 138 lb (62.6 kg)   LMP 12/24/2015   SpO2 99%   BMI 26.95 kg/m  No data found.   Physical Exam  Constitutional: She appears well-developed and well-nourished. No distress.  Pt sitting on exam bed, appears mildly fatigued but non-toxic. NAD.  HENT:  Head: Normocephalic and atraumatic.  Right Ear: Tympanic membrane normal.  Left Ear: Tympanic  membrane normal.  Nose: Mucosal edema present. Right sinus exhibits frontal sinus tenderness. Right sinus exhibits no maxillary sinus tenderness. Left sinus exhibits frontal sinus tenderness. Left sinus exhibits no maxillary sinus tenderness.  Mouth/Throat: Uvula is midline, oropharynx is clear and moist and mucous membranes are normal.  Eyes: Conjunctivae are normal. No scleral icterus.  Neck: Normal range of motion. Neck supple.   Cardiovascular: Normal rate, regular rhythm and normal heart sounds.   Pulmonary/Chest: Effort normal and breath sounds normal. No stridor. No respiratory distress. She has no wheezes. She has no rales.  Abdominal: Soft. She exhibits no distension. There is no tenderness.  Musculoskeletal: Normal range of motion.  Lymphadenopathy:    She has no cervical adenopathy.  Neurological: She is alert.  Skin: Skin is warm and dry. She is not diaphoretic.  Nursing note and vitals reviewed.   Urgent Care Course   Clinical Course     Procedures (including critical care time)  Labs Review Labs Reviewed  POCT INFLUENZA A/B    Imaging Review Dg Chest 2 View  Result Date: 01/14/2016 CLINICAL DATA:  Cough and congestion for 3 days EXAM: CHEST  2 VIEW COMPARISON:  None. FINDINGS: The heart size and mediastinal contours are within normal limits. Both lungs are clear. The visualized skeletal structures are unremarkable. IMPRESSION: No active cardiopulmonary disease. Electronically Signed   By: Natasha MeadLiviu  Pop M.D.   On: 01/14/2016 12:50    MDM   1. Flu-like symptoms    Pt presenting to UC with flu-like symptoms for about 4 days. Pt understands she is outside of recommended 48 hour treatment time, but would like to be tested for flu so she can tell work if needed.  CXR: no evidence of pneumonia. Flu test: Negative  Symptoms likely viral. Encouraged symptomatic treatment. Rx: Flonase and Tessalon  F/u with PCP in 1 week if not improving, sooner if worsening.     Junius Finnerrin O'Malley, PA-C 01/14/16 1336

## 2016-01-14 NOTE — ED Triage Notes (Signed)
Sinus pain, pressure, congestion, cough, chest hurts, body aches, chills x 4 days

## 2016-01-15 ENCOUNTER — Other Ambulatory Visit: Payer: Self-pay

## 2016-01-15 DIAGNOSIS — F419 Anxiety disorder, unspecified: Principal | ICD-10-CM

## 2016-01-15 DIAGNOSIS — F32A Depression, unspecified: Secondary | ICD-10-CM

## 2016-01-15 DIAGNOSIS — F329 Major depressive disorder, single episode, unspecified: Secondary | ICD-10-CM

## 2016-01-15 MED ORDER — VILAZODONE HCL 20 MG PO TABS: 2.0000 | ORAL_TABLET | Freq: Every day | ORAL | 0 refills | Status: DC

## 2016-02-17 ENCOUNTER — Telehealth: Payer: Self-pay | Admitting: *Deleted

## 2016-02-17 ENCOUNTER — Other Ambulatory Visit: Payer: Self-pay | Admitting: *Deleted

## 2016-02-17 DIAGNOSIS — F329 Major depressive disorder, single episode, unspecified: Secondary | ICD-10-CM

## 2016-02-17 DIAGNOSIS — F32A Depression, unspecified: Secondary | ICD-10-CM

## 2016-02-17 DIAGNOSIS — F419 Anxiety disorder, unspecified: Principal | ICD-10-CM

## 2016-02-17 MED ORDER — VILAZODONE HCL 20 MG PO TABS: 2.0000 | ORAL_TABLET | Freq: Every day | ORAL | 0 refills | Status: DC

## 2016-02-17 NOTE — Telephone Encounter (Signed)
Received notification from pharmacy that insurance will only pay for one tablet per day of viibryd. Would you like to change the prescription to one a day and have her f/u? You have never seen this patient before. Really she needs an appointment.Hommel last saw her

## 2016-02-18 ENCOUNTER — Other Ambulatory Visit: Payer: Self-pay | Admitting: Osteopathic Medicine

## 2016-02-18 DIAGNOSIS — F419 Anxiety disorder, unspecified: Principal | ICD-10-CM

## 2016-02-18 DIAGNOSIS — F329 Major depressive disorder, single episode, unspecified: Secondary | ICD-10-CM

## 2016-02-18 DIAGNOSIS — F32A Depression, unspecified: Secondary | ICD-10-CM

## 2016-02-18 MED ORDER — VILAZODONE HCL 40 MG PO TABS: 40.0000 mg | ORAL_TABLET | Freq: Every day | ORAL | 0 refills | Status: DC

## 2016-02-18 NOTE — Telephone Encounter (Signed)
Ok to change to 40mg  one tablet daily #30. Needs appt.

## 2016-02-24 ENCOUNTER — Encounter: Payer: Self-pay | Admitting: Physician Assistant

## 2016-02-24 ENCOUNTER — Ambulatory Visit (INDEPENDENT_AMBULATORY_CARE_PROVIDER_SITE_OTHER): Payer: Self-pay | Admitting: Physician Assistant

## 2016-02-24 VITALS — BP 131/81 | HR 87 | Ht 60.0 in | Wt 140.0 lb

## 2016-02-24 DIAGNOSIS — F32A Depression, unspecified: Secondary | ICD-10-CM

## 2016-02-24 DIAGNOSIS — F419 Anxiety disorder, unspecified: Principal | ICD-10-CM

## 2016-02-24 DIAGNOSIS — F329 Major depressive disorder, single episode, unspecified: Secondary | ICD-10-CM

## 2016-02-24 DIAGNOSIS — F418 Other specified anxiety disorders: Secondary | ICD-10-CM

## 2016-02-24 DIAGNOSIS — F5103 Paradoxical insomnia: Secondary | ICD-10-CM | POA: Insufficient documentation

## 2016-02-24 MED ORDER — DIPHENHYDRAMINE HCL (SLEEP) 25 MG PO TBDP: 1.0000 | ORAL_TABLET | Freq: Every day | ORAL | 0 refills | Status: DC | PRN

## 2016-02-24 MED ORDER — VILAZODONE HCL 40 MG PO TABS: 40.0000 mg | ORAL_TABLET | Freq: Every day | ORAL | 1 refills | Status: DC

## 2016-02-24 NOTE — Progress Notes (Signed)
   Subjective:    Patient ID: Ann Black, female    DOB: 01/25/1991, 25 y.o.   MRN: [id number]  HPI  Pt is a 25 yo female who presents to the clinic to establish care.   .. Active Ambulatory Problems    Diagnosis Date Noted  . 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 C. difficile diarrhea 10/17/2012  . Anxiety and depression 12/27/2012  . Hyperlipidemia 03/28/2013  . Diarrhea 08/31/2013  . Proteinuria 04/19/2014  . Left breast mass 01/20/2015  . Nephrolith 08/08/2015  . Left flexor carpi radialis rupture 08/08/2015  . Scar 08/15/2015   Resolved Ambulatory Problems    Diagnosis Date Noted  . No Resolved Ambulatory Problems   Past Medical History:  Diagnosis Date  . 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 Helicobacter pylori (H. pylori)    .Ann Kitchen. Family History  Problem Relation Age of Onset  . Hyperlipidemia Father   . Hypertension Father   . Hyperlipidemia Paternal Uncle    .Ann Kitchen. Social History   Social History  . Marital status: Single    Spouse name: N/A  . Number of children: N/A  . Years of education: N/A   Occupational History  . Not on file.   Social History Main Topics  . Smoking status: Never Smoker  . Smokeless tobacco: Never Used  . Alcohol use 0.0 oz/week     Comment: 1-2 x  a month  . Drug use: No  . Sexual activity: Yes    Birth control/ protection: Pill   Other Topics Concern  . Not on file   Social History Narrative  . No narrative on file   GAD- she is doing very well on vibryd 40mg . She has no complaints or concerns. She does have some problems going to sleep. She has tried melatonin and magnesium with little help. She feels like she cannot cut off her brain. She has not tried any prescription medications for sleep.     Review of Systems  All other systems reviewed and are negative.      Objective:   Physical Exam  Constitutional: She is oriented to person, place, and time. She appears  well-developed and well-nourished.  HENT:  Head: Normocephalic and atraumatic.  Cardiovascular: Normal rate, regular rhythm and normal heart sounds.   Pulmonary/Chest: Effort normal and breath sounds normal. She has no wheezes.  Neurological: She is alert and oriented to person, place, and time.  Psychiatric: She has a normal mood and affect. Her behavior is normal.          Assessment & Plan:  Ann BlackAnn BlackKeondra was seen today for depression and anxiety.  Diagnoses and all orders for this visit:  Anxiety and depression -     Vilazodone HCl (VIIBRYD) 40 MG TABS; Take 1 tablet (40 mg total) by mouth daily.  Paradoxical insomnia -     DiphenhydrAMINE HCl, Sleep, 25 MG TBDP; Take 1 tablet (25 mg total) by mouth daily as needed.   Refilled viibyrd for 6 months.  unisom to try as needed for sleep.

## 2016-03-20 ENCOUNTER — Other Ambulatory Visit: Payer: Self-pay | Admitting: Physician Assistant

## 2016-04-13 ENCOUNTER — Telehealth: Payer: Self-pay | Admitting: Physician Assistant

## 2016-04-13 NOTE — Telephone Encounter (Signed)
Called pt unable to leave vm not set up yet 04/13/16 @ 11:06am

## 2016-05-17 ENCOUNTER — Other Ambulatory Visit: Payer: Self-pay | Admitting: *Deleted

## 2016-05-17 DIAGNOSIS — F419 Anxiety disorder, unspecified: Principal | ICD-10-CM

## 2016-05-17 DIAGNOSIS — F329 Major depressive disorder, single episode, unspecified: Secondary | ICD-10-CM

## 2016-05-17 DIAGNOSIS — F32A Depression, unspecified: Secondary | ICD-10-CM

## 2016-05-17 MED ORDER — VILAZODONE HCL 40 MG PO TABS: 40.0000 mg | ORAL_TABLET | Freq: Every day | ORAL | 1 refills | Status: DC

## 2016-06-28 ENCOUNTER — Ambulatory Visit (INDEPENDENT_AMBULATORY_CARE_PROVIDER_SITE_OTHER): Payer: PRIVATE HEALTH INSURANCE | Admitting: Physician Assistant

## 2016-06-28 VITALS — BP 132/73 | HR 71 | Ht 60.0 in | Wt 138.0 lb

## 2016-06-28 DIAGNOSIS — Z23 Encounter for immunization: Secondary | ICD-10-CM | POA: Diagnosis not present

## 2016-06-28 NOTE — Progress Notes (Signed)
Patient was in the office to get PPD and Tdap vaccine. Patient denied anything abnormal. She was given 0.5 ml tuberculin Purified Protein Derivative  intradermally left lower arm. Patient left paper work which is in PCP basket to be signed and picked up Wednesday. Hung Rhinesmith,CMA

## 2016-06-30 ENCOUNTER — Ambulatory Visit (INDEPENDENT_AMBULATORY_CARE_PROVIDER_SITE_OTHER): Payer: No Typology Code available for payment source | Admitting: Physician Assistant

## 2016-06-30 VITALS — BP 125/57 | HR 68

## 2016-06-30 DIAGNOSIS — Z23 Encounter for immunization: Secondary | ICD-10-CM

## 2016-06-30 LAB — TB SKIN TEST
INDURATION: 0 mm
TB SKIN TEST: NEGATIVE

## 2016-06-30 NOTE — Progress Notes (Signed)
Pt came into clinic today for PPD read. PPD test was negative, 0mm induration. Pt reports she needs a two step PPD, second step scheduled for placement next week. Form filled out from today's visit and placed in PCP's CMA's Pt forms file. Will give to Pt after second PPD read, per Pt request. No further questions.   Agree with above plan.

## 2016-07-07 ENCOUNTER — Ambulatory Visit (INDEPENDENT_AMBULATORY_CARE_PROVIDER_SITE_OTHER): Payer: PRIVATE HEALTH INSURANCE | Admitting: Physician Assistant

## 2016-07-07 VITALS — BP 122/72 | HR 63

## 2016-07-07 DIAGNOSIS — Z111 Encounter for screening for respiratory tuberculosis: Secondary | ICD-10-CM | POA: Diagnosis not present

## 2016-07-07 NOTE — Progress Notes (Signed)
Pt came into clinic today for second PPD test placement. 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 She has never tested positive for TB in the past. Pt tolerated PPD placement in right forearm well, no immediate complications. PPD form was updated and placed back at PCP's CMA's desk for completion at PPD read on Friday. No further questions/concerns.   Agree with above plan. Tandy Gaw PA-C

## 2016-07-09 ENCOUNTER — Ambulatory Visit: Payer: PRIVATE HEALTH INSURANCE

## 2016-07-09 ENCOUNTER — Ambulatory Visit (INDEPENDENT_AMBULATORY_CARE_PROVIDER_SITE_OTHER): Payer: PRIVATE HEALTH INSURANCE | Admitting: Physician Assistant

## 2016-07-09 VITALS — BP 131/79 | HR 86

## 2016-07-09 DIAGNOSIS — Z111 Encounter for screening for respiratory tuberculosis: Secondary | ICD-10-CM | POA: Diagnosis not present

## 2016-07-09 LAB — TB SKIN TEST
INDURATION: 0 mm
TB SKIN TEST: NEGATIVE

## 2016-07-09 NOTE — Progress Notes (Signed)
Pt came into clinic today for PPD read. Test was negative. Form for school completed and given with an attached copy of her immunization record. No further questions/concerns.

## 2016-07-26 ENCOUNTER — Ambulatory Visit: Payer: PRIVATE HEALTH INSURANCE | Admitting: Physician Assistant

## 2016-08-09 ENCOUNTER — Ambulatory Visit: Payer: PRIVATE HEALTH INSURANCE | Admitting: Physician Assistant

## 2016-08-09 DIAGNOSIS — Z0189 Encounter for other specified special examinations: Secondary | ICD-10-CM

## 2017-06-22 ENCOUNTER — Other Ambulatory Visit: Payer: Self-pay

## 2017-06-22 ENCOUNTER — Emergency Department (INDEPENDENT_AMBULATORY_CARE_PROVIDER_SITE_OTHER)
Admission: EM | Admit: 2017-06-22 | Discharge: 2017-06-22 | Disposition: A | Discharge status: Home / Self Care | Payer: BLUE CROSS/BLUE SHIELD | Source: Home / Self Care | Attending: Family Medicine | Admitting: Family Medicine

## 2017-06-22 DIAGNOSIS — B9789 Other viral agents as the cause of diseases classified elsewhere: Secondary | ICD-10-CM

## 2017-06-22 DIAGNOSIS — J069 Acute upper respiratory infection, unspecified: Secondary | ICD-10-CM | POA: Diagnosis not present

## 2017-06-22 MED ORDER — DOXYCYCLINE HYCLATE 100 MG PO CAPS: 100.0000 mg | ORAL_CAPSULE | Freq: Two times a day (BID) | ORAL | 0 refills | Status: DC

## 2017-06-22 MED ORDER — BENZONATATE 200 MG PO CAPS: ORAL_CAPSULE | ORAL | 0 refills | Status: DC

## 2017-06-22 NOTE — ED Provider Notes (Signed)
[facility]    CSN: [id number] Arrival date & time: 06/22/17  1938     History   Chief Complaint Chief Complaint  Patient presents with  . Cough    HPI Ann Black is a 27 y.o. female.   Patient complains of five day history of typical cold-like symptoms developing over several days, including mild sore throat, sinus congestion, headache, fatigue, chills, and cough.  She reports that she is under increased stress, both attending school and working full time.  The history is provided by the patient.    Past Medical History:  Diagnosis Date  . 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 Helicobacter pylori (H. pylori)    past hystory( did have endoscopy)    Patient Active Problem List   Diagnosis Date Noted  . 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 Scar 08/15/2015  . Nephrolith 08/08/2015  . Left flexor carpi radialis rupture 08/08/2015  . Left breast mass 01/20/2015  . Proteinuria 04/19/2014  . Diarrhea 08/31/2013  . Hyperlipidemia 03/28/2013  . Anxiety and depression 12/27/2012  . C. difficile diarrhea 10/17/2012  . 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 No pertinent surgical history.  OB History   None      Home Medications    Prior to Admission medications   Medication Sig Start Date End Date Taking? Authorizing Provider  benzonatate (TESSALON) 200 MG capsule Take one cap by mouth at bedtime as needed for cough.  May repeat in 4 to 6 hours 06/22/17   Lattie Haw, MD  desogestrel-ethinyl estradiol (APRI,EMOQUETTE,SOLIA) 0.15-30 MG-MCG tablet Take 1 tablet by mouth daily.    [provider]  DiphenhydrAMINE HCl, Sleep, 25 MG TBDP Take 1 tablet (25 mg total) by mouth daily as needed. 02/24/16   Breeback, Lonna Cobb, PA-C  doxycycline (VIBRAMYCIN) 100 MG capsule Take 1 capsule (100 mg total) by mouth 2 (two) times daily. Take with food (Rx void after 06/30/17) 06/22/17   Lattie Haw, MD  Vilazodone HCl (VIIBRYD) 40 MG TABS Take 1 tablet (40 mg total) by mouth daily. 05/17/16   Jomarie Longs, PA-C    Family History Family History  Problem Relation Age of Onset  . Hyperlipidemia Father   . Hypertension Father   . Hyperlipidemia Paternal Uncle     Social History Social History   Tobacco Use  . Smoking status: Never Smoker  . Smokeless tobacco: Never Used  Substance Use Topics  . Alcohol use: Yes    Alcohol/week: 0.0 oz    Comment: 1-2 x  a month  . Drug use: No     Allergies   Amoxicillin and Azithromycin   Review of Systems Review of Systems + sore throat + cough No pleuritic pain No wheezing + nasal congestion + post-nasal drainage No sinus pain/pressure No itchy/red eyes No earache No hemoptysis No SOB No fever, + chills No nausea No vomiting No abdominal pain No diarrhea No urinary symptoms No skin rash + fatigue + myalgias + headache Used OTC meds without relief   Physical Exam Triage Vital Signs ED Triage Vitals [06/22/17 1952]  Enc Vitals Group     BP (!) 139/97     Pulse Rate 76     Resp 18     Temp 97.9 F (36.6 C)     Temp Source Oral     SpO2 100 %     Weight 138 lb (62.6 kg)  Height 5' (1.524 m)     Head Circumference      Peak Flow      Pain Score 0     Pain Loc      Pain Edu?      Excl. in GC?    No data found.  Updated Vital Signs BP (!) 139/97 (BP Location: Right Arm)   Pulse 76   Temp 97.9 F (36.6 C) (Oral)   Resp 18   Ht 5' (1.524 m)   Wt 138 lb (62.6 kg)   SpO2 100%   BMI 26.95 kg/m   Visual Acuity Right Eye Distance:   Left Eye Distance:   Bilateral Distance:    Right Eye Near:   Left Eye Near:    Bilateral Near:     Physical Exam Nursing notes and Vital Signs reviewed. Appearance:  Patient appears stated age, and in no acute distress Eyes:  Pupils are equal, round, and reactive to light and accomodation.  Extraocular movement is intact.  Conjunctivae are not  inflamed  Ears:  Canals normal.  Tympanic membranes normal.  Nose:  Mildly congested turbinates.  No sinus tenderness.   Pharynx:  Normal Neck:  Supple.  Enlarged posterior/lateral nodes are palpated bilaterally, tender to palpation on the left.   Lungs:  Clear to auscultation.  Breath sounds are equal.  Moving air well. Heart:  Regular rate and rhythm without murmurs, rubs, or gallops.  Abdomen:  Nontender without masses or hepatosplenomegaly.  Bowel sounds are present.  No CVA or flank tenderness.  Extremities:  No edema.  Skin:  No rash present.    UC Treatments / Results  Labs (all labs ordered are listed, but only abnormal results are displayed) Labs Reviewed - No data to display  EKG None Radiology No results found.  Procedures Procedures (including critical care time)  Medications Ordered in UC Medications - No data to display   Initial Impression / Assessment and Plan / UC Course  I have reviewed the triage vital signs and the nursing notes.  Pertinent labs & imaging results that were available during my care of the patient were reviewed by me and considered in my medical decision making (see chart for details).    There is no evidence of bacterial infection today.  Treat symptomatically for now  Prescription written for Benzonatate (Tessalon) to take at bedtime for night-time cough.  Take plain guaifenesin (1200mg  extended release tabs such as Mucinex) twice daily, with plenty of water, for cough and congestion.  May add Pseudoephedrine (30mg , one or two every 4 to 6 hours) for sinus congestion.  Get adequate rest.   May use Afrin nasal spray (or generic oxymetazoline) each morning for about 5 days and then discontinue.  Also recommend using saline nasal spray several times daily and saline nasal irrigation (AYR is a common brand).   Try warm salt water gargles for sore throat.  Stop all antihistamines for now, and other non-prescription cough/cold preparations. May  take Ibuprofen 200mg , 4 tabs every 8 hours with food for fever, body aches, headache, etc. May take Delsym Cough Suppressant with Tessalon at bedtime for nighttime cough.  Begin Doxycycline if not improving about one week or if persistent fever develops (Given a prescription to hold, with an expiration date)  Follow-up with family doctor if not improving about10 to 14 days.     Final Clinical Impressions(s) / UC Diagnoses   Final diagnoses:  Viral URI with cough    ED Discharge Orders  Ordered    benzonatate (TESSALON) 200 MG capsule     06/22/17 2018    doxycycline (VIBRAMYCIN) 100 MG capsule  2 times daily     06/22/17 2021         Lattie HawBeese, Donnelle Rubey A, MD 06/23/17 1108

## 2017-06-22 NOTE — Discharge Instructions (Signed)
Take plain guaifenesin (1200mg  extended release tabs such as Mucinex) twice daily, with plenty of water, for cough and congestion.  May add Pseudoephedrine (30mg , one or two every 4 to 6 hours) for sinus congestion.  Get adequate rest.   May use Afrin nasal spray (or generic oxymetazoline) each morning for about 5 days and then discontinue.  Also recommend using saline nasal spray several times daily and saline nasal irrigation (AYR is a common brand).   Try warm salt water gargles for sore throat.  Stop all antihistamines for now, and other non-prescription cough/cold preparations. May take Ibuprofen 200mg , 4 tabs every 8 hours with food for fever, body aches, headache, etc. May take Delsym Cough Suppressant with Tessalon at bedtime for nighttime cough.  Begin Doxycycline if not improving about one week or if persistent fever develops  Follow-up with family doctor if not improving about10 to 14 days.

## 2017-06-22 NOTE — ED Triage Notes (Signed)
Pt c/o of cold symptoms since Saturday. Productive cough, nasal congestion with green phlegm.

## 2017-11-28 ENCOUNTER — Encounter: Payer: Self-pay | Admitting: Physician Assistant

## 2017-11-28 ENCOUNTER — Ambulatory Visit (INDEPENDENT_AMBULATORY_CARE_PROVIDER_SITE_OTHER): Payer: BLUE CROSS/BLUE SHIELD | Admitting: Physician Assistant

## 2017-11-28 VITALS — BP 127/78 | HR 60 | Ht 60.0 in | Wt 138.0 lb

## 2017-11-28 DIAGNOSIS — F419 Anxiety disorder, unspecified: Secondary | ICD-10-CM

## 2017-11-28 DIAGNOSIS — Z0000 Encounter for general adult medical examination without abnormal findings: Secondary | ICD-10-CM | POA: Diagnosis not present

## 2017-11-28 DIAGNOSIS — F329 Major depressive disorder, single episode, unspecified: Secondary | ICD-10-CM

## 2017-11-28 DIAGNOSIS — R292 Abnormal reflex: Secondary | ICD-10-CM | POA: Diagnosis not present

## 2017-11-28 DIAGNOSIS — F32A Depression, unspecified: Secondary | ICD-10-CM

## 2017-11-28 MED ORDER — VILAZODONE HCL 10 MG PO TABS: ORAL_TABLET | ORAL | 1 refills | Status: DC

## 2017-11-28 NOTE — Patient Instructions (Signed)

## 2017-11-28 NOTE — Progress Notes (Signed)
Subjective:     Ann Black is a 27 y.o. female and is here for a comprehensive physical exam. The patient reports problems - see below.    Patient would like to discuss her overall anxiety.  In the past she had tried Celexa and Effexor.  They do not seem to work effectively.  She was switched to Viibryd.  She feels like that worked well.  She did lose her insurance and can no longer afford it.  She has been off since.  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  This is very stressful to her.  She always feels like her mind is going strong.  She feels like she cannot shut down at night.  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  She denies any suicidal or homicidal thoughts.  Social History   Socioeconomic History  . Marital status: Single    Spouse name: Not on file  . Number of children: Not on file  . Years of education: Not on file  . Highest education level: Not on file  Occupational History  . Not on file  Social Needs  . Financial resource strain: Not on file  . Food insecurity:    Worry: Not on file    Inability: Not on file  . Transportation needs:    Medical: Not on file    Non-medical: Not on file  Tobacco Use  . Smoking status: Never Smoker  . Smokeless tobacco: Never Used  Substance and Sexual Activity  . Alcohol use: Yes    Alcohol/week: 0.0 standard drinks    Comment: 1-2 x  a month  . Drug use: No  . Sexual activity: Yes    Birth control/protection: Pill  Lifestyle  . Physical activity:    Days per week: Not on file    Minutes per session: Not on file  . Stress: Not on file  Relationships  . Social connections:    Talks on phone: Not on file    Gets together: Not on file    Attends religious service: Not on file    Active member of club or organization: Not on file    Attends meetings of clubs or organizations: Not on file    Relationship status: Not on file  . Intimate partner violence:    Fear of current  or ex partner: Not on file    Emotionally abused: Not on file    Physically abused: Not on file    Forced sexual activity: Not on file  Other Topics Concern  . Not on file  Social History Narrative  . Not on file   Health Maintenance  Topic Date Due  . PAP SMEAR  12/14/2010  . INFLUENZA VACCINE  03/08/2018 (Originally 10/06/2017)  . HIV Screening  11/29/2018 (Originally 12/13/2004)  . TETANUS/TDAP  06/29/2026    The following portions of the patient's history were reviewed and updated as appropriate: allergies, current medications, past family history, past medical history, past social history, past surgical history and problem list.  Review of Systems Pertinent items noted in HPI and remainder of comprehensive ROS otherwise negative.   Objective:    BP 127/78   Pulse 60   Ht 5' (1.524 m)   Wt 138 lb (62.6 kg)   BMI 26.95 kg/m  General appearance: alert, cooperative and appears stated age Head: Normocephalic, without obvious abnormality, atraumatic Eyes: conjunctivae/corneas clear. PERRL, EOM's intact. Fundi benign. Ears: normal TM's  and external ear canals both ears Nose: Nares normal. Septum midline. Mucosa normal. No drainage or sinus tenderness. Throat: lips, mucosa, and tongue normal; teeth and gums normal Neck: no adenopathy, no carotid bruit, no JVD, supple, symmetrical, trachea midline and thyroid not enlarged, symmetric, no tenderness/mass/nodules Back: symmetric, no curvature. ROM normal. No CVA tenderness. Lungs: clear to auscultation bilaterally Heart: regular rate and rhythm, S1, S2 normal, no murmur, click, rub or gallop Abdomen: soft, non-tender; bowel sounds normal; no masses,  no organomegaly Extremities: extremities normal, atraumatic, no cyanosis or edema Pulses: 2+ and symmetric Skin: Skin color, texture, turgor normal. No rashes or lesions Lymph nodes: Cervical, supraclavicular, and axillary nodes normal. Neurologic: Reflexes: 2+ and symmetric 3+  symmetric patellar reflexes.     Assessment:    Healthy female exam.      Plan:      Marland KitchenMarland KitchenChelsey was seen today for annual exam.  Diagnoses and all orders for this visit:  Routine physical examination -     TSH  Anxiety and depression -     Vilazodone HCl (VIIBRYD) 10 MG TABS; Take one tablet for 2 weeks then increase to 2 tablets daily. -     TSH  Hyperreflexia -     TSH    .[name] Depression screen PHQ 2/9 11/28/2017  Decreased Interest 1  Down, Depressed, Hopeless 1  PHQ - 2 Score 2  Altered sleeping 2  Tired, decreased energy 2  Change in appetite 1  Feeling bad or failure about yourself  0  Trouble concentrating 2  Moving slowly or fidgety/restless 0  Suicidal thoughts 0  PHQ-9 Score 9  Difficult doing work/chores Somewhat difficult   .[name] GAD 7 : Generalized Anxiety Score 11/28/2017  Nervous, Anxious, on Edge 3  Control/stop worrying 3  Worry too much - different things 3  Trouble relaxing 3  Restless 3  Easily annoyed or irritable 3  Afraid - awful might happen 3  Total GAD 7 Score 21  Anxiety Difficulty Extremely difficult   .[name] Discussed 150 minutes of exercise a week.  Encouraged vitamin D 1000 units and Calcium 1300mg  or 4 servings of dairy a day.  Biometrics will be done with work.  Added TSH due to some hyperreflexia and increase in anxiety.  Pt declined flu shot.  Pap smears done through [facility] and will call to get report.   Will start viibryd. Discussed taper up to 20mg . Follow up in 4-6 weeks. Coupon card given.   See After Visit Summary for Counseling Recommendations

## 2017-11-29 LAB — TSH: TSH: 1.8 m[IU]/L

## 2017-11-29 NOTE — Progress Notes (Signed)
Call pt: thyroid looks great. I wanted to confirm we did not order any fasting labs/biometrics because you stated your company had a standing order?

## 2017-12-06 ENCOUNTER — Telehealth: Payer: Self-pay | Admitting: Physician Assistant

## 2017-12-06 NOTE — Telephone Encounter (Signed)
Received fax from [employer] that Viibrtyd requires a PA. Information has been sent to the insurance company. Awaiting determination.

## 2017-12-14 NOTE — Telephone Encounter (Signed)
Approvedon October 2  Effective from 12/06/2017 through 12/04/2020. Pharmacy aware.

## 2017-12-24 IMAGING — DX DG WRIST COMPLETE 3+V*L*
4 series · 4 of 4 positions shown · non-contrast
Comparison: No prior .

CLINICAL DATA: Pain.  Prior fall.

EXAM:
LEFT WRIST - COMPLETE 3+ VIEW

[wrist pa]
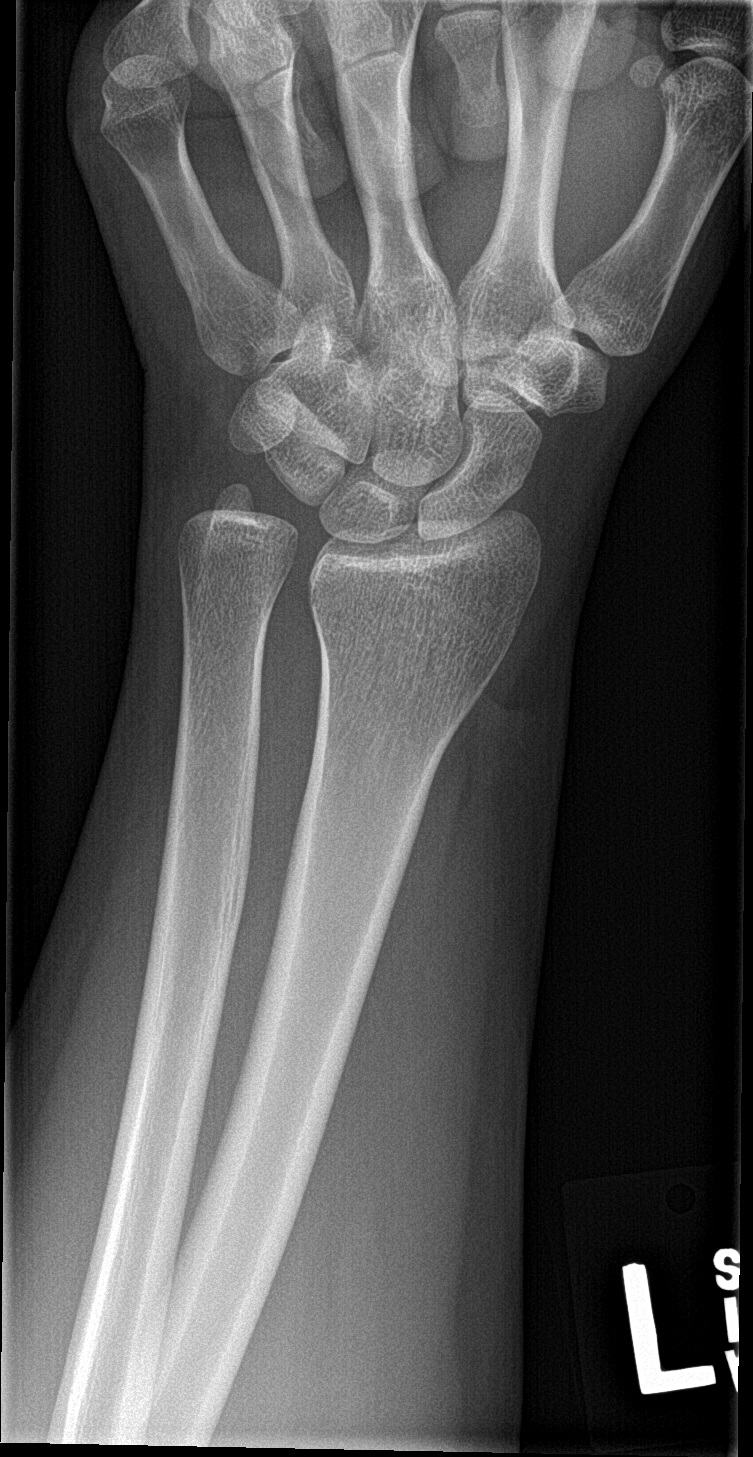

[wrist obl]
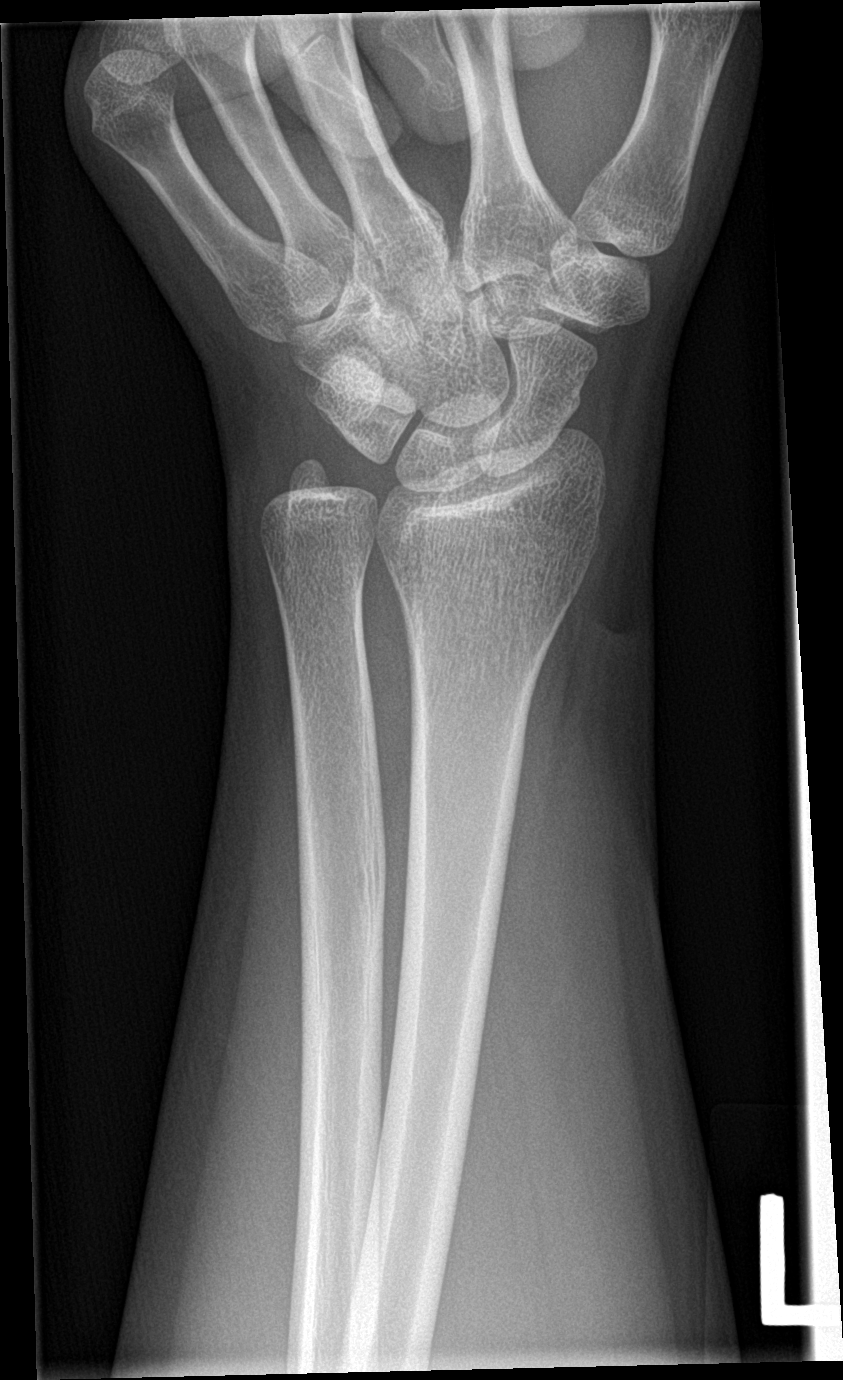

[wrist lat]
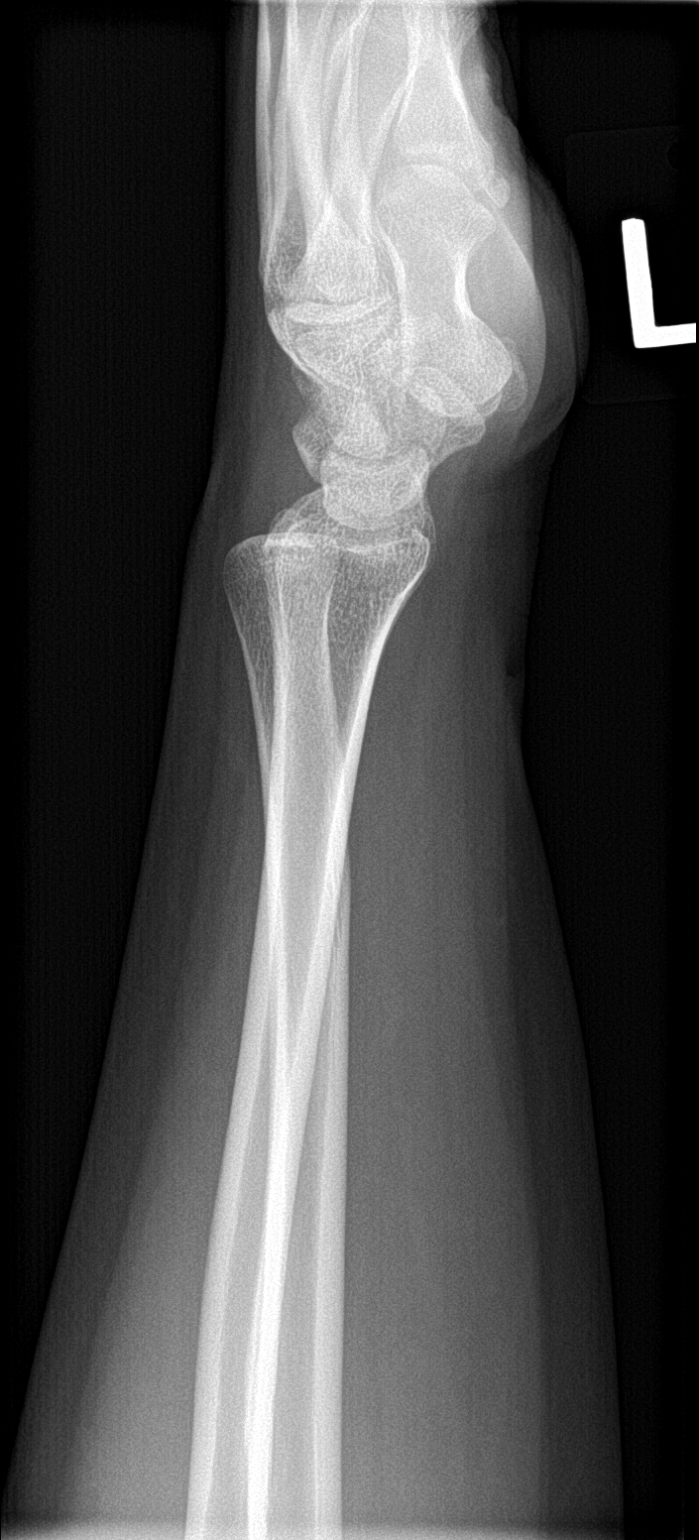

[wrist navicular]
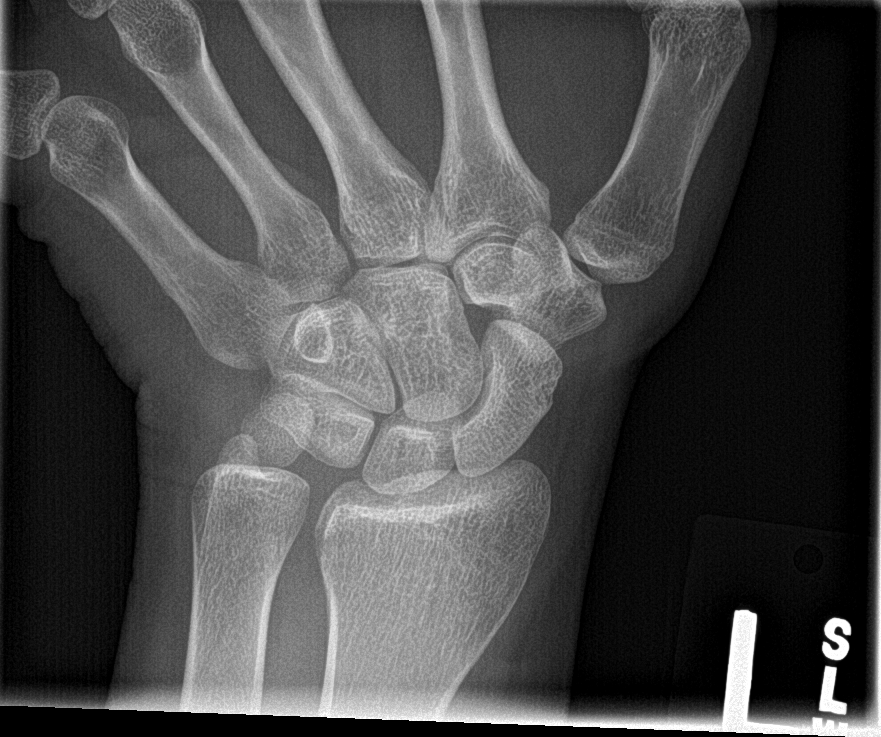

[4 of 4 positions shown; findings below may reference images not displayed]

FINDINGS: There is no evidence of fracture or dislocation. There is no
evidence of arthropathy or other focal bone abnormality. Soft
tissues are unremarkable.
IMPRESSION: Negative.

## 2017-12-26 ENCOUNTER — Ambulatory Visit: Payer: Self-pay | Admitting: Physician Assistant

## 2017-12-26 ENCOUNTER — Telehealth: Payer: Self-pay

## 2017-12-26 NOTE — Telephone Encounter (Addendum)
Pt called at 8:15 to cancel day of appt at 10:30. Pt states she will call back at later time to reschedule  FYI to PCP

## 2018-01-18 ENCOUNTER — Ambulatory Visit (INDEPENDENT_AMBULATORY_CARE_PROVIDER_SITE_OTHER): Payer: BLUE CROSS/BLUE SHIELD | Admitting: Physician Assistant

## 2018-01-18 ENCOUNTER — Encounter: Payer: Self-pay | Admitting: Physician Assistant

## 2018-01-18 VITALS — BP 118/78 | HR 66 | Ht 60.0 in | Wt 139.0 lb

## 2018-01-18 DIAGNOSIS — F411 Generalized anxiety disorder: Secondary | ICD-10-CM | POA: Diagnosis not present

## 2018-01-18 MED ORDER — VILAZODONE HCL 40 MG PO TABS: 40.0000 mg | ORAL_TABLET | Freq: Every day | ORAL | 1 refills | Status: DC

## 2018-01-18 MED ORDER — BUSPIRONE HCL 7.5 MG PO TABS: 7.5000 mg | ORAL_TABLET | Freq: Two times a day (BID) | ORAL | 1 refills | Status: DC

## 2018-01-18 NOTE — Progress Notes (Signed)
   Subjective:    Patient ID: Ann Black, female    DOB: 05/01/1990, 27 y.o.   MRN: [id number]  HPI Pt is a 27 yo female with GAD who presents to the clinic for 1 month follow up. She was started on viibryd. She has noticed very little benefit in anxiety but some improvement with depression symptoms. She stills has worries that keep her from going out and doing more things. Mother and father suffer with anxiety and depression. 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 She is doing well on the job. The job does get to her some. No SI/HC. Both celexa and effexor tried in the past and made her too numb.   .. Active Ambulatory Problems    Diagnosis Date Noted  . 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 C. difficile diarrhea 10/17/2012  . Anxiety and depression 12/27/2012  . Hyperlipidemia 03/28/2013  . Diarrhea 08/31/2013  . Proteinuria 04/19/2014  . Left breast mass 01/20/2015  . Nephrolith 08/08/2015  . Left flexor carpi radialis rupture 08/08/2015  . Scar 08/15/2015  . 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 Hyperreflexia 11/28/2017   Resolved Ambulatory Problems    Diagnosis Date Noted  . No Resolved Ambulatory Problems   Past Medical History:  Diagnosis Date  . Helicobacter pylori (H. pylori)     Review of Systems See HPI.     Objective:   Physical Exam  Constitutional: She is oriented to person, place, and time. She appears well-developed and well-nourished.  HENT:  Head: Normocephalic and atraumatic.  Cardiovascular: Normal rate and regular rhythm.  Pulmonary/Chest: Effort normal and breath sounds normal.  Neurological: She is alert and oriented to person, place, and time.  Psychiatric: She has a normal mood and affect. Her behavior is normal.          Assessment & Plan:  [name].[name].Diagnoses and all orders for this visit:  GAD (generalized anxiety disorder) -     Vilazodone HCl (VIIBRYD) 40 MG TABS; Take 1 tablet (40 mg total) by mouth daily. -      busPIRone (BUSPAR) 7.5 MG tablet; Take 1 tablet (7.5 mg total) by mouth 2 (two) times daily.   Depression screen PHQ 2/9 11/28/2017  Decreased Interest 1  Down, Depressed, Hopeless 1  PHQ - 2 Score 2  Altered sleeping 2  Tired, decreased energy 2  Change in appetite 1  Feeling bad or failure about yourself  0  Trouble concentrating 2  Moving slowly or fidgety/restless 0  Suicidal thoughts 0  PHQ-9 Score 9  Difficult doing work/chores Somewhat difficult   .[name]. GAD 7 : Generalized Anxiety Score 11/28/2017  Nervous, Anxious, on Edge 3  Control/stop worrying 3  Worry too much - different things 3  Trouble relaxing 3  Restless 3  Easily annoyed or irritable 3  Afraid - awful might happen 3  Total GAD 7 Score 21  Anxiety Difficulty Extremely difficult    Numbers have not decreased a substantial amount since addition of viibryd. Pt would like to try higher dose before trying another medication. I really think zoloft could be a good option. Increased viibryd 40mg  for now and then added buspar. Follow up in 4-6 weeks via mychart first to confirm there is some improvement. If not need to consider other options. Discussed CBT ways to help with anxiety.   [name]..Spent 30 minutes with patient and greater than 50 percent of visit spent counseling patient regarding treatment plan.

## 2018-01-19 ENCOUNTER — Encounter: Payer: Self-pay | Admitting: Physician Assistant

## 2018-02-01 ENCOUNTER — Telehealth: Payer: Self-pay

## 2018-02-01 NOTE — Telephone Encounter (Signed)
Called pt- she has noticed an improvement with anxiety. She did note she did increase the Viibryd, but does not feel that is causing her to lose sleep.   Advised pt to decrease to 1/2 tab BID and call back if no improvement. Pt agreeable

## 2018-02-01 NOTE — Telephone Encounter (Signed)
Pt left msg stating that since starting Buspar- she has not been able to sleep at night. Pt complains of thins going on for a couple weeks and it is not resolving.   Please advise

## 2018-02-01 NOTE — Telephone Encounter (Signed)
Did she notice any improvement of anxiety? If she did maybe cut back to 1/2 tablet twice a day. If no improvement with anxiety then I would stop buspar completely. Didn't we increase the viibryd as well? Do you think could be the increase in viibryd?

## 2018-02-10 ENCOUNTER — Other Ambulatory Visit: Payer: Self-pay | Admitting: Physician Assistant

## 2018-02-10 DIAGNOSIS — F411 Generalized anxiety disorder: Secondary | ICD-10-CM

## 2018-03-15 ENCOUNTER — Other Ambulatory Visit: Payer: Self-pay | Admitting: Physician Assistant

## 2018-03-15 DIAGNOSIS — F411 Generalized anxiety disorder: Secondary | ICD-10-CM

## 2018-05-15 ENCOUNTER — Other Ambulatory Visit: Payer: Self-pay | Admitting: Physician Assistant

## 2018-05-15 DIAGNOSIS — F411 Generalized anxiety disorder: Secondary | ICD-10-CM

## 2018-06-12 ENCOUNTER — Other Ambulatory Visit: Payer: Self-pay

## 2018-06-12 ENCOUNTER — Telehealth (INDEPENDENT_AMBULATORY_CARE_PROVIDER_SITE_OTHER): Payer: BLUE CROSS/BLUE SHIELD | Admitting: Physician Assistant

## 2018-06-12 ENCOUNTER — Encounter: Payer: Self-pay | Admitting: Physician Assistant

## 2018-06-12 VITALS — HR 80 | Temp 98.9°F | Wt 135.0 lb

## 2018-06-12 DIAGNOSIS — Z20828 Contact with and (suspected) exposure to other viral communicable diseases: Secondary | ICD-10-CM | POA: Diagnosis not present

## 2018-06-12 DIAGNOSIS — F430 Acute stress reaction: Secondary | ICD-10-CM | POA: Diagnosis not present

## 2018-06-12 DIAGNOSIS — F329 Major depressive disorder, single episode, unspecified: Secondary | ICD-10-CM

## 2018-06-12 DIAGNOSIS — F419 Anxiety disorder, unspecified: Secondary | ICD-10-CM

## 2018-06-12 DIAGNOSIS — F411 Generalized anxiety disorder: Secondary | ICD-10-CM

## 2018-06-12 DIAGNOSIS — Z20822 Contact with and (suspected) exposure to covid-19: Secondary | ICD-10-CM

## 2018-06-12 DIAGNOSIS — F32A Depression, unspecified: Secondary | ICD-10-CM

## 2018-06-12 MED ORDER — VILAZODONE HCL 40 MG PO TABS: 40.0000 mg | ORAL_TABLET | Freq: Every day | ORAL | 1 refills | Status: DC

## 2018-06-12 MED ORDER — BUSPIRONE HCL 7.5 MG PO TABS: 7.5000 mg | ORAL_TABLET | Freq: Two times a day (BID) | ORAL | 1 refills | Status: DC

## 2018-06-12 MED ORDER — ALPRAZOLAM 0.5 MG PO TABS: 0.5000 mg | ORAL_TABLET | Freq: Every evening | ORAL | 0 refills | Status: DC | PRN

## 2018-06-12 MED ORDER — HYDROXYZINE HCL 10 MG PO TABS: 10.0000 mg | ORAL_TABLET | Freq: Three times a day (TID) | ORAL | 0 refills | Status: DC | PRN

## 2018-06-12 NOTE — Progress Notes (Signed)
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

## 2018-06-14 DIAGNOSIS — F410 Panic disorder [episodic paroxysmal anxiety] without agoraphobia: Secondary | ICD-10-CM | POA: Insufficient documentation

## 2018-06-14 DIAGNOSIS — F411 Generalized anxiety disorder: Secondary | ICD-10-CM | POA: Insufficient documentation

## 2018-06-14 DIAGNOSIS — F430 Acute stress reaction: Secondary | ICD-10-CM | POA: Insufficient documentation

## 2018-06-14 NOTE — Progress Notes (Signed)
Patient ID: Ann KinsChelsey Lona, female   DOB: 09/24/1990, 28 y.o.   MRN: [id number]  .[name].Virtual Visit via Video Note  I connected with Ann Black on 06/14/18 at 11:30 AM EDT by a video enabled telemedicine application and verified that I am speaking with the correct person using two identifiers.   I discussed the limitations of evaluation and management by telemedicine and the availability of in person appointments. The patient expressed understanding and agreed to proceed.  History of Present Illness: Patient is a 28 year old female with history of anxiety and depression who complains of increasing anxiety.  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  She has at least 3 known exposures. Some of her exposures have been longer durations like 2 hours.  Her employer is refusing to test her for code and quarantine if positive.  She is more concerned about her risk of exposing other patients than her risk of personally contracting Covid.  She feels like things are changing every day and the variables are so unknown.  She just wants to talk about with somebody and see what the best next step for her is.  She is currently having no cough, SOB, fever, fatigue.   .. Active Ambulatory Problems    Diagnosis Date Noted  . 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 C. difficile diarrhea 10/17/2012  . Anxiety and depression 12/27/2012  . Hyperlipidemia 03/28/2013  . Diarrhea 08/31/2013  . Proteinuria 04/19/2014  . Left breast mass 01/20/2015  . Nephrolith 08/08/2015  . Left flexor carpi radialis rupture 08/08/2015  . Scar 08/15/2015  . 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 Hyperreflexia 11/28/2017  . GAD (generalized anxiety disorder) 06/14/2018  . Stress reaction 06/14/2018   Resolved Ambulatory Problems    Diagnosis Date Noted  . No Resolved Ambulatory Problems   Past Medical History:  Diagnosis Date  . Helicobacter pylori (H. pylori)    Reviewed med, allergy, problem list.     Observations/Objective: No acute distress.   .. Today's Vitals   06/12/18 1114  Pulse: 80  Temp: 98.9 F (37.2 C)  TempSrc: Oral  Weight: 135 lb (61.2 kg)   Body mass index is 26.37 kg/m.  .. [facility] 06/12/2018 01/18/2018 11/28/2017  Decreased Interest 0 0 1  Down, Depressed, Hopeless 0 0 1  PHQ - 2 Score 0 0 2  Altered sleeping 1 2 2   Tired, decreased energy 1 1 2   Change in appetite 0 0 1  Feeling bad or failure about yourself  0 1 0  Trouble concentrating 1 1 2   Moving slowly or fidgety/restless 1 0 0  Suicidal thoughts 0 0 0  PHQ-9 Score 4 5 9   Difficult doing work/chores Very difficult - Somewhat difficult   .[name]. GAD 7 : Generalized Anxiety Score 06/12/2018 01/18/2018 11/28/2017  Nervous, Anxious, on Edge 1 3 3   Control/stop worrying 1 3 3   Worry too much - different things 1 3 3   Trouble relaxing 1 3 3   Restless 1 3 3   Easily annoyed or irritable 2 3 3   Afraid - awful might happen 0 3 3  Total GAD 7 Score 7 21 21   Anxiety Difficulty Extremely difficult Very difficult Extremely difficult      Assessment and Plan: [name].[name].Diagnoses and all orders for this visit:  GAD (generalized anxiety disorder) -     Vilazodone HCl (VIIBRYD) 40 MG TABS; Take 1 tablet (40 mg total) by mouth daily. Administer with food. -  busPIRone (BUSPAR) 7.5 MG tablet; Take 1 tablet (7.5 mg total) by mouth 2 (two) times daily. -     hydrOXYzine (ATARAX/VISTARIL) 10 MG tablet; Take 1 tablet (10 mg total) by mouth 3 (three) times daily as needed. For anxiety. -     ALPRAZolam (XANAX) 0.5 MG tablet; Take 1 tablet (0.5 mg total) by mouth at bedtime as needed for anxiety. -     Ambulatory referral to Psychology  Close Exposure to Covid-19 Virus  Stress reaction -     hydrOXYzine (ATARAX/VISTARIL) 10 MG tablet; Take 1 tablet (10 mg total) by mouth 3 (three) times daily as needed. For anxiety. -     ALPRAZolam (XANAX) 0.5 MG tablet; Take 1 tablet (0.5 mg total) by mouth at  bedtime as needed for anxiety. -     Ambulatory referral to Psychology  Anxiety and depression   Spent some time discussing testing protocols for COVID.  Reassured her that her company is not the only company not testing healthcare workers who have been exposed.  Reassured her at least she has the option to wear PPE at her discretion.  Reassured her that I know her anxiety was heightened during this period of time but she is essential to getting through this crisis.  I do think her mental health is important.  I recommended her not taking any additional shifts.  I do think she needs some time to decompress through the crisis.  I did go ahead and add Vistaril for during the day up to 3 times a day as needed.  Reassured her this is not a controlled substance and safer to take.  I did give her a few Xanax to take at night when she is having a rough time calming down for as needed for acute anxiety.  I do think in general talking with someone can be very beneficial especially through this pandemic.  I strongly encouraged on her days off meditation, exercise.  I would like for her to avoid any excessive news watching.  Patient did feel better after discussing some of the reasoning behind the testing protocols and statistical evidence.   Follow up in one month to see how she is doing.   Follow Up Instructions:    I discussed the assessment and treatment plan with the patient. The patient was provided an opportunity to ask questions and all were answered. The patient agreed with the plan and demonstrated an understanding of the instructions.   The patient was advised to call back or seek an in-person evaluation if the symptoms worsen or if the condition fails to improve as anticipated.  I provided 30 minutes of non-face-to-face time during this encounter.   Tandy Gaw, PA-C

## 2018-07-05 ENCOUNTER — Other Ambulatory Visit: Payer: Self-pay | Admitting: Physician Assistant

## 2018-07-05 DIAGNOSIS — F411 Generalized anxiety disorder: Secondary | ICD-10-CM

## 2018-07-05 DIAGNOSIS — F430 Acute stress reaction: Secondary | ICD-10-CM

## 2018-07-28 DIAGNOSIS — K011 Impacted teeth: Secondary | ICD-10-CM | POA: Diagnosis not present

## 2018-09-11 ENCOUNTER — Telehealth: Payer: Self-pay

## 2018-09-11 NOTE — Telephone Encounter (Signed)
PA for Viibryd is required. Patient advised to send [place] a copy of her new insurance information.

## 2018-09-18 NOTE — Telephone Encounter (Signed)
Approved today (viibryd) Effective from 09/18/2018 through 09/16/2021. Pharmacy aware. Attempted to call patient and let her know about the approval and VM was not set up.

## 2018-09-19 ENCOUNTER — Ambulatory Visit (INDEPENDENT_AMBULATORY_CARE_PROVIDER_SITE_OTHER): Payer: BC Managed Care – PPO | Admitting: Physician Assistant

## 2018-09-19 ENCOUNTER — Encounter: Payer: Self-pay | Admitting: Physician Assistant

## 2018-09-19 VITALS — BP 112/80 | Temp 97.4°F | Ht 60.0 in | Wt 144.0 lb

## 2018-09-19 DIAGNOSIS — F4329 Adjustment disorder with other symptoms: Secondary | ICD-10-CM | POA: Diagnosis not present

## 2018-09-19 DIAGNOSIS — F411 Generalized anxiety disorder: Secondary | ICD-10-CM

## 2018-09-19 DIAGNOSIS — Z124 Encounter for screening for malignant neoplasm of cervix: Secondary | ICD-10-CM | POA: Diagnosis not present

## 2018-09-19 DIAGNOSIS — F329 Major depressive disorder, single episode, unspecified: Secondary | ICD-10-CM | POA: Diagnosis not present

## 2018-09-19 DIAGNOSIS — Z202 Contact with and (suspected) exposure to infections with a predominantly sexual mode of transmission: Secondary | ICD-10-CM | POA: Diagnosis not present

## 2018-09-19 DIAGNOSIS — F32A Depression, unspecified: Secondary | ICD-10-CM

## 2018-09-19 DIAGNOSIS — Z01419 Encounter for gynecological examination (general) (routine) without abnormal findings: Secondary | ICD-10-CM | POA: Diagnosis not present

## 2018-09-19 DIAGNOSIS — F419 Anxiety disorder, unspecified: Secondary | ICD-10-CM

## 2018-09-19 DIAGNOSIS — Z3041 Encounter for surveillance of contraceptive pills: Secondary | ICD-10-CM | POA: Diagnosis not present

## 2018-09-19 MED ORDER — VORTIOXETINE HBR 5 MG PO TABS: 5.0000 mg | ORAL_TABLET | Freq: Every day | ORAL | 1 refills | Status: DC

## 2018-09-19 MED ORDER — HYDROXYZINE HCL 50 MG PO TABS: 50.0000 mg | ORAL_TABLET | Freq: Every evening | ORAL | 3 refills | Status: DC | PRN

## 2018-09-19 NOTE — Progress Notes (Signed)
Patient ID: Ann Black, female   DOB: 02/16/1991, 28 y.o.   MRN: [id number] .[name].Virtual Visit via Telephone Note  I connected with Ann Kinshelsey Kamel on 09/22/18 at 11:10 AM EDT by telephone and verified that I am speaking with the correct person using two identifiers.  Location: Patient: home Provider: clinic   I discussed the limitations, risks, security and privacy concerns of performing an evaluation and management service by telephone and the availability of in person appointments. I also discussed with the patient that there may be a patient responsible charge related to this service. The patient expressed understanding and agreed to proceed.   History of Present Illness: Pt is a 28 yo female with adjustment disorder with mixed anxiety and depression she comes in for follow up. She has been really anxious and depression during the COVID 19 pandemic. 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 She has a very toxic partner right now she is trying to get out of working with. When she works with other people her mood is much better. She does not feel like vibryd is helping. buspar did not help at all. Vistaril helps but makes her sleepy. She is using this to help her sleep at night. Xanax did not help at all.   .. Active Ambulatory Problems    Diagnosis Date Noted  . 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 Anxiety and depression 12/27/2012  . Hyperlipidemia 03/28/2013  . Proteinuria 04/19/2014  . Left breast mass 01/20/2015  . Nephrolith 08/08/2015  . Left flexor carpi radialis rupture 08/08/2015  . Scar 08/15/2015  . 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 Hyperreflexia 11/28/2017  . GAD (generalized anxiety disorder) 06/14/2018  . Stress reaction 06/14/2018   Resolved Ambulatory Problems    Diagnosis Date Noted  . C. difficile diarrhea 10/17/2012  . Diarrhea 08/31/2013   Past Medical History:  Diagnosis Date  . Helicobacter pylori (H.  pylori)    Reviewed med, allergy, problem list.   Observations/Objective: NO acute distress.   .. Today's Vitals   09/19/18 1012  BP: 112/80  Temp: (!) 97.4 F (36.3 C)  TempSrc: Oral  Weight: 144 lb (65.3 kg)  Height: 5' (1.524 m)   Body mass index is 28.12 kg/m.   .. [facility] 09/19/2018 09/19/2018 06/12/2018 01/18/2018 11/28/2017  Decreased Interest 0 0 0 0 1  Down, Depressed, Hopeless 0 0 0 0 1  PHQ - 2 Score 0 0 0 0 2  Altered sleeping 1 - 1 2 2   Tired, decreased energy 1 - 1 1 2   Change in appetite 0 - 0 0 1  Feeling bad or failure about yourself  1 - 0 1 0  Trouble concentrating 0 - 1 1 2   Moving slowly or fidgety/restless 0 - 1 0 0  Suicidal thoughts 0 - 0 0 0  PHQ-9 Score 3 - 4 5 9   Difficult doing work/chores Somewhat difficult - Very difficult - Somewhat difficult   .[name]. GAD 7 : Generalized Anxiety Score 09/19/2018 06/12/2018 01/18/2018 11/28/2017  Nervous, Anxious, on Edge 2 1 3 3   Control/stop worrying 2 1 3 3   Worry too much - different things 2 1 3 3   Trouble relaxing 2 1 3 3   Restless 2 1 3 3   Easily annoyed or irritable 3 2 3 3   Afraid - awful might happen 1 0 3 3  Total GAD 7 Score 14 7 21 21   Anxiety Difficulty Very difficult  Extremely difficult Very difficult Extremely difficult      Assessment and Plan: Marland KitchenMarland KitchenChelsey was seen today for anxiety.  Diagnoses and all orders for this visit:  Adjustment disorder with disturbance of emotion -     hydrOXYzine (ATARAX/VISTARIL) 50 MG tablet; Take 1 tablet (50 mg total) by mouth at bedtime and may repeat dose one time if needed. For itching. -     vortioxetine HBr (TRINTELLIX) 5 MG TABS tablet; Take 1 tablet (5 mg total) by mouth daily.  Anxiety and depression -     hydrOXYzine (ATARAX/VISTARIL) 50 MG tablet; Take 1 tablet (50 mg total) by mouth at bedtime and may repeat dose one time if needed. For itching. -     vortioxetine HBr (TRINTELLIX) 5 MG TABS tablet; Take 1 tablet (5 mg total) by  mouth daily.  GAD (generalized anxiety disorder) -     hydrOXYzine (ATARAX/VISTARIL) 50 MG tablet; Take 1 tablet (50 mg total) by mouth at bedtime and may repeat dose one time if needed. For itching.   Pt has tried celexa, effexor, viibryd with no results. Will try trintellix. Discussed side effects. Discussed tapering off viibryd with 1/2 tab and 1/2 tab trintellix then after 10 days stop viibryd and start full tab of trintellix. Increased vistaril to work better for anxiety and sleep. Encouraged her to work out work situation and would likely help her mood.   Follow up in 4 weeks.    Follow Up Instructions:    I discussed the assessment and treatment plan with the patient. The patient was provided an opportunity to ask questions and all were answered. The patient agreed with the plan and demonstrated an understanding of the instructions.   The patient was advised to call back or seek an in-person evaluation if the symptoms worsen or if the condition fails to improve as anticipated.     Iran Planas, PA-C

## 2018-09-19 NOTE — Progress Notes (Signed)
Still having anxiety with work. Trouble sleeping. PHQ9-GAD7 completed.

## 2018-09-29 NOTE — Telephone Encounter (Addendum)
Approved today (Trintellix) Effective from 09/29/2018 through 09/27/2021. Pharmacy aware and will process. Patient is aware as well.

## 2018-10-11 ENCOUNTER — Other Ambulatory Visit: Payer: Self-pay | Admitting: Physician Assistant

## 2018-10-11 DIAGNOSIS — F329 Major depressive disorder, single episode, unspecified: Secondary | ICD-10-CM

## 2018-10-11 DIAGNOSIS — F411 Generalized anxiety disorder: Secondary | ICD-10-CM

## 2018-10-11 DIAGNOSIS — F32A Depression, unspecified: Secondary | ICD-10-CM

## 2018-10-11 DIAGNOSIS — F4329 Adjustment disorder with other symptoms: Secondary | ICD-10-CM

## 2018-10-11 DIAGNOSIS — F419 Anxiety disorder, unspecified: Secondary | ICD-10-CM

## 2018-10-20 ENCOUNTER — Telehealth: Payer: Self-pay

## 2018-10-20 NOTE — Telephone Encounter (Signed)
Ann Black called and left a message stating she has kidney stones again. She wanted something called in. I called her back. Unable to reach patient, voicemail is full.

## 2018-11-07 ENCOUNTER — Other Ambulatory Visit: Payer: Self-pay

## 2018-11-07 ENCOUNTER — Emergency Department (INDEPENDENT_AMBULATORY_CARE_PROVIDER_SITE_OTHER)
Admission: EM | Admit: 2018-11-07 | Discharge: 2018-11-07 | Disposition: A | Discharge status: Home / Self Care | Payer: BC Managed Care – PPO | Source: Home / Self Care

## 2018-11-07 DIAGNOSIS — R6889 Other general symptoms and signs: Secondary | ICD-10-CM

## 2018-11-07 DIAGNOSIS — J040 Acute laryngitis: Secondary | ICD-10-CM | POA: Diagnosis not present

## 2018-11-07 LAB — POCT RAPID STREP A (OFFICE): Rapid Strep A Screen: NEGATIVE

## 2018-11-07 MED ORDER — PREDNISONE 50 MG PO TABS: 50.0000 mg | ORAL_TABLET | Freq: Every day | ORAL | 0 refills | Status: AC

## 2018-11-07 NOTE — Discharge Instructions (Addendum)
°  You may take 500mg acetaminophen every 4-6 hours or in combination with ibuprofen 400-600mg every 6-8 hours as needed for pain, inflammation, and fever. ° °Be sure to well hydrated with clear liquids and get at least 8 hours of sleep at night, preferably more while sick.  ° °Please follow up with family medicine in 1 week if needed. ° ° °Due to concern for possibly having Covid-19, it is advised that you self-isolate at home until test results come back.  If positive, it is recommended you stay isolated for at least 10 days after symptom onset and 24 hours after last fever without taking medication (whichever is longer).  If you MUST go out, please wear a mask at all times, limit contact with others.  ° °

## 2018-11-07 NOTE — ED Triage Notes (Signed)
Pt started feeling bad on Saturday.  Has had a sore throat, fever, hoarseness since.

## 2018-11-07 NOTE — ED Provider Notes (Signed)
[facility]    CSN: [id number] Arrival date & time: 11/07/18  1922      History   Chief Complaint Chief Complaint  Patient presents with  . Cough  . Fever  . Sore Throat    HPI Ann Black is a 28 y.o. female.   HPI Ann Black is a 28 y.o. female presenting to UC with c/o 4 days of worsening sore throat, body aches, fatigue, low grade fever and hoarse voice.  She has tried OTC medications with mild relief. Denies chest pain or SOB. Denies n/v/d.   Past Medical History:  Diagnosis Date  . 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 Helicobacter pylori (H. pylori)    past hystory( did have endoscopy)    Patient Active Problem List   Diagnosis Date Noted  . GAD (generalized anxiety disorder) 06/14/2018  . Stress reaction 06/14/2018  . Hyperreflexia 11/28/2017  . 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 Scar 08/15/2015  . Nephrolith 08/08/2015  . Left flexor carpi radialis rupture 08/08/2015  . Left breast mass 01/20/2015  . Proteinuria 04/19/2014  . Hyperlipidemia 03/28/2013  . Anxiety and depression 12/27/2012  . 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 No pertinent surgical history.  OB History   No obstetric history on file.      Home Medications    Prior to Admission medications   Medication Sig Start Date End Date Taking? Authorizing Provider  desogestrel-ethinyl estradiol (APRI,EMOQUETTE,SOLIA) 0.15-30 MG-MCG tablet Take 1 tablet by mouth daily.    [provider]  hydrOXYzine (ATARAX/VISTARIL) 50 MG tablet TAKE 1 TABLET BY MOUTH AT BEDTIME AND MAY REPEAT DOSE ONE TIME IF NEEDED. FOR ITCHING. 10/11/18   Breeback, Jade L, PA-C  predniSONE (DELTASONE) 50 MG tablet Take 1 tablet (50 mg total) by mouth daily with breakfast for 5 days. 11/07/18 11/12/18  Noe Gens, PA-C  vortioxetine HBr (TRINTELLIX) 5 MG TABS tablet Take 1 tablet (5 mg total) by mouth daily. 09/19/18   Donella Stade, PA-C    Family History Family History  Problem Relation Age of Onset  . Hyperlipidemia Father   . Hypertension Father   . Hyperlipidemia Paternal Uncle     Social History Social History   Tobacco Use  . Smoking status: Never Smoker  . Smokeless tobacco: Never Used  Substance Use Topics  . Alcohol use: Yes    Alcohol/week: 0.0 standard drinks    Comment: 1-2 x  a month  . Drug use: No     Allergies   Amoxicillin and Azithromycin   Review of Systems Review of Systems  Constitutional: Positive for fatigue and fever. Negative for chills.  HENT: Positive for congestion (mild), sore throat and voice change. Negative for ear pain and trouble swallowing.   Respiratory: Negative for cough and shortness of breath.   Cardiovascular: Negative for chest pain and palpitations.  Gastrointestinal: Negative for abdominal pain, diarrhea, nausea and vomiting.  Musculoskeletal: Positive for arthralgias and myalgias. Negative for back pain.  Skin: Negative for rash.     Physical Exam Triage Vital Signs ED Triage Vitals  Enc Vitals Group     BP 11/07/18 1937 (!) 147/99     Pulse Rate 11/07/18 1937 81     Resp 11/07/18 1937 20     Temp 11/07/18 1937 98 F (36.7 C)     Temp src --      SpO2 11/07/18 1937 98 %     Weight  11/07/18 1938 142 lb (64.4 kg)     Height 11/07/18 1938 5' (1.524 m)     Head Circumference --      Peak Flow --      Pain Score 11/07/18 1938 1     Pain Loc --      Pain Edu? --      Excl. in GC? --    No data found.  Updated Vital Signs BP (!) 147/99 (BP Location: Right Arm)   Pulse 81   Temp 98 F (36.7 C)   Resp 20   Ht 5' (1.524 m)   Wt 142 lb (64.4 kg)   LMP 10/25/2018   SpO2 98%   BMI 27.73 kg/m   Visual Acuity Right Eye Distance:   Left Eye Distance:   Bilateral Distance:    Right Eye Near:   Left Eye Near:    Bilateral Near:     Physical Exam Vitals signs and nursing note reviewed.  Constitutional:      Appearance: She is  well-developed.  HENT:     Head: Normocephalic and atraumatic.     Right Ear: Tympanic membrane normal.     Left Ear: Tympanic membrane normal.     Nose: Nose normal.     Mouth/Throat:     Lips: Pink.     Pharynx: Oropharynx is clear. Uvula midline. Posterior oropharyngeal erythema present. No pharyngeal swelling, oropharyngeal exudate or uvula swelling.  Neck:     Musculoskeletal: Normal range of motion and neck supple.  Cardiovascular:     Rate and Rhythm: Normal rate and regular rhythm.  Pulmonary:     Effort: Pulmonary effort is normal. No respiratory distress.     Breath sounds: Normal breath sounds. No stridor. No wheezing, rhonchi or rales.     Comments: Hoarse voice w/o stridor. Musculoskeletal: Normal range of motion.  Lymphadenopathy:     Cervical: No cervical adenopathy.  Skin:    General: Skin is warm and dry.  Neurological:     Mental Status: She is alert and oriented to person, place, and time.  Psychiatric:        Behavior: Behavior normal.      UC Treatments / Results  Labs (all labs ordered are listed, but only abnormal results are displayed) Labs Reviewed  NOVEL CORONAVIRUS, NAA   Narrative:    Performed at:  [address] - [facility] [place] [address], [place], [place]  [id number] Lab Director: Jolene SchimkeSanjai Nagendra MD, Phone:  [phone]  POCT RAPID STREP A (OFFICE)    EKG   Radiology No results found.  Procedures Procedures (including critical care time)  Medications Ordered in UC Medications - No data to display  Initial Impression / Assessment and Plan / UC Course  I have reviewed the triage vital signs and the nursing notes.  Pertinent labs & imaging results that were available during my care of the patient were reviewed by me and considered in my medical decision making (see chart for details).     Rapid strep: NEGATIVE Covid-19 pending Will start pt on prednisone for symptomatic tx  Final Clinical Impressions(s) / UC Diagnoses   Final  diagnoses:  Laryngitis  Flu-like symptoms     Discharge Instructions      You may take 500mg  acetaminophen every 4-6 hours or in combination with ibuprofen 400-600mg  every 6-8 hours as needed for pain, inflammation, and fever.  Be sure to well hydrated with clear liquids and get at least 8 hours of sleep at night,  preferably more while sick.   Please follow up with family medicine in 1 week if needed.  Due to concern for possibly having Covid-19, it is advised that you self-isolate at home until test results come back.  If positive, it is recommended you stay isolated for at least 10 days after symptom onset and 24 hours after last fever without taking medication (whichever is longer).  If you MUST go out, please wear a mask at all times, limit contact with others.      ED Prescriptions    Medication Sig Dispense Auth. Provider   predniSONE (DELTASONE) 50 MG tablet Take 1 tablet (50 mg total) by mouth daily with breakfast for 5 days. 5 tablet Lurene Shadow, PA-C     Controlled Substance Prescriptions [place] Controlled Substance Registry consulted? Not Applicable   Rolla Plate 11/10/18 0737

## 2018-11-10 LAB — NOVEL CORONAVIRUS, NAA: SARS-CoV-2, NAA: NOT DETECTED

## 2018-11-30 ENCOUNTER — Other Ambulatory Visit: Payer: Self-pay | Admitting: Physician Assistant

## 2018-11-30 DIAGNOSIS — F329 Major depressive disorder, single episode, unspecified: Secondary | ICD-10-CM

## 2018-11-30 DIAGNOSIS — F32A Depression, unspecified: Secondary | ICD-10-CM

## 2018-11-30 DIAGNOSIS — F4329 Adjustment disorder with other symptoms: Secondary | ICD-10-CM

## 2018-12-11 ENCOUNTER — Other Ambulatory Visit: Payer: Self-pay | Admitting: Physician Assistant

## 2018-12-11 DIAGNOSIS — F411 Generalized anxiety disorder: Secondary | ICD-10-CM

## 2019-01-10 ENCOUNTER — Ambulatory Visit (INDEPENDENT_AMBULATORY_CARE_PROVIDER_SITE_OTHER): Payer: BC Managed Care – PPO | Admitting: Sports Medicine

## 2019-01-10 ENCOUNTER — Encounter: Payer: Self-pay | Admitting: Sports Medicine

## 2019-01-10 DIAGNOSIS — B349 Viral infection, unspecified: Secondary | ICD-10-CM | POA: Diagnosis not present

## 2019-01-10 DIAGNOSIS — U071 COVID-19: Secondary | ICD-10-CM | POA: Insufficient documentation

## 2019-01-10 MED ORDER — DEXAMETHASONE 4 MG PO TABS: 4.0000 mg | ORAL_TABLET | Freq: Three times a day (TID) | ORAL | 0 refills | Status: DC

## 2019-01-10 NOTE — Assessment & Plan Note (Addendum)
Exposure to COVID-19 on 2 occasions, upper respiratory symptoms, coughing, anosmia, ageusia. Moderate fatigue without fevers or chills. 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 She will go to the testing site tomorrow morning.

## 2019-01-10 NOTE — Progress Notes (Signed)
Virtual Visit via Telephone   I connected with  Ann Black  on 01/10/19 by telephone/telehealth and verified that I am speaking with the correct person using two identifiers.   I discussed the limitations, risks, security and privacy concerns of performing an evaluation and management service by telephone, including the higher likelihood of inaccurate diagnosis and treatment, and the availability of in person appointments.  We also discussed the likely need of an additional face to face encounter for complete and high quality delivery of care.  I also discussed with the patient that there may be a patient responsible charge related to this service. The patient expressed understanding and wishes to proceed.  Provider location is either at home or medical facility. Patient location is at their home, different from provider location. People involved in care of the patient during this telehealth encounter were myself, my nurse/medical assistant, and my front office/scheduling team member.  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  She has a mild cough.  I reviewed the past medical history, family history, social history, surgical history, and allergies today and no changes were needed.  Please see the problem list section below in epic for further details.  Past Medical History: Past Medical History:  Diagnosis Date  . 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 Helicobacter pylori (H. pylori)    past hystory( did have endoscopy)   Past Surgical History: No past surgical history on file. Social History: Social History   Socioeconomic History  . Marital status: Single    Spouse name: Not on file  . Number of children: Not on file  . Years of education:  Not on file  . Highest education level: Not on file  Occupational History  . Not on file  Social Needs  . Financial resource strain: Not on file  . Food insecurity    Worry: Not on file    Inability: Not on file  . Transportation needs    Medical: Not on file    Non-medical: Not on file  Tobacco Use  . Smoking status: Never Smoker  . Smokeless tobacco: Never Used  Substance and Sexual Activity  . Alcohol use: Yes    Alcohol/week: 0.0 standard drinks    Comment: 1-2 x  a month  . Drug use: No  . Sexual activity: Yes    Birth control/protection: Pill  Lifestyle  . Physical activity    Days per week: Not on file    Minutes per session: Not on file  . Stress: Not on file  Relationships  . Social Herbalist on phone: Not on file    Gets together: Not on file    Attends religious service: Not on file    Active member of club or organization: Not on file    Attends meetings of clubs or organizations: Not on file    Relationship status: Not on file  Other Topics Concern  . Not on file  Social History Narrative  . Not on file   Family History: Family History  Problem Relation Age of Onset  . Hyperlipidemia Father   . Hypertension Father   . Hyperlipidemia Paternal Uncle    Allergies: Allergies  Allergen Reactions  . Amoxicillin     Diarrhea  .  Azithromycin Hives    rash   Medications: See med rec.  Review of Systems: No fevers, chills, night sweats, weight loss, chest pain, or shortness of breath.   Objective:    General: Speaking full sentences, no audible heavy breathing.  Sounds alert and appropriately interactive.  No other physical exam performed due to the non-face to face nature of this visit.  Impression and Recommendations:    Viral syndrome Exposure to COVID-19 on 2 occasions, upper respiratory symptoms, coughing, anosmia, ageusia. Moderate fatigue without fevers or chills. 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 She will go to the testing site tomorrow morning.   I discussed the above assessment and treatment plan with the patient. The patient was provided an opportunity to ask questions and all were answered. The patient agreed with the plan and demonstrated an understanding of the instructions.   The patient was advised to call back or seek an in-person evaluation if the symptoms worsen or if the condition fails to improve as anticipated.   I provided 25 minutes of non-face-to-face time during this encounter, 15 minutes of additional time was needed to gather information, review chart, records, communicate/coordinate with staff remotely, and complete documentation.   ___________________________________________ Ihor Austin. Benjamin Stain, M.D., ABFM., CAQSM. Primary Care and Sports Medicine [facility]  Adjunct Professor of Family Medicine  [employer]

## 2019-01-11 ENCOUNTER — Other Ambulatory Visit: Payer: Self-pay

## 2019-01-11 DIAGNOSIS — Z20822 Contact with and (suspected) exposure to covid-19: Secondary | ICD-10-CM

## 2019-01-12 ENCOUNTER — Other Ambulatory Visit: Payer: Self-pay | Admitting: Physician Assistant

## 2019-01-12 DIAGNOSIS — F419 Anxiety disorder, unspecified: Secondary | ICD-10-CM

## 2019-01-12 DIAGNOSIS — F411 Generalized anxiety disorder: Secondary | ICD-10-CM

## 2019-01-12 DIAGNOSIS — F4329 Adjustment disorder with other symptoms: Secondary | ICD-10-CM

## 2019-01-12 DIAGNOSIS — F32A Depression, unspecified: Secondary | ICD-10-CM

## 2019-01-12 DIAGNOSIS — F329 Major depressive disorder, single episode, unspecified: Secondary | ICD-10-CM

## 2019-01-13 LAB — NOVEL CORONAVIRUS, NAA: SARS-CoV-2, NAA: DETECTED — AB

## 2019-01-15 ENCOUNTER — Telehealth: Payer: Self-pay

## 2019-01-15 NOTE — Progress Notes (Signed)
Please contact patient and let her know of positive result. She will need to quarantine 10 days after symptom onset. Provide note for work and other symptomatic care. If need virtual to discuss symptoms happy to do.

## 2019-01-15 NOTE — Telephone Encounter (Signed)
Pt advised.

## 2019-01-15 NOTE — Telephone Encounter (Signed)
Done

## 2019-01-15 NOTE — Telephone Encounter (Signed)
Received call from triage nurse at patient engagement center advising that patient tested positive for COVID.   Luvenia Starch, will you review the lab so that I can call and advise her of results and recommendations?   Thanks!

## 2019-01-17 NOTE — Telephone Encounter (Addendum)
Letter written and can be downloaded

## 2019-01-17 NOTE — Telephone Encounter (Signed)
Patient was advised and she understands about not working this weekend. Her next day of work would not be until 01/24/2019. Can you write her out until that time from 01/11/2019. Please advise.

## 2019-01-17 NOTE — Telephone Encounter (Signed)
Patient is aware and did not have any questions.  

## 2019-01-17 NOTE — Telephone Encounter (Signed)
Is it ok to say she was out of work from 11/05 through 01/19/2019? Please advise.

## 2019-01-17 NOTE — Telephone Encounter (Signed)
Frankly I think it is too soon to send her back on Saturday.  She was positive for COVID-19 with significant symptoms tested on 01/11/2019 f.  It really needs to be at least 7 to 14 days after the resolution of symptoms as the virus is often still shed for that long.

## 2019-01-17 NOTE — Telephone Encounter (Signed)
Patient is needing a work note to be extended through this Friday and go back to work on this Saturday the 14th. She is still not feeling well.  Please advise.

## 2019-01-29 ENCOUNTER — Other Ambulatory Visit: Payer: Self-pay | Admitting: Physician Assistant

## 2019-01-29 DIAGNOSIS — F4329 Adjustment disorder with other symptoms: Secondary | ICD-10-CM

## 2019-01-29 DIAGNOSIS — F329 Major depressive disorder, single episode, unspecified: Secondary | ICD-10-CM

## 2019-01-29 DIAGNOSIS — F411 Generalized anxiety disorder: Secondary | ICD-10-CM

## 2019-01-29 DIAGNOSIS — F32A Depression, unspecified: Secondary | ICD-10-CM

## 2019-05-17 ENCOUNTER — Telehealth: Payer: Self-pay | Admitting: Neurology

## 2019-05-17 NOTE — Telephone Encounter (Signed)
Attempted to reach PT. No VM set up.

## 2019-05-17 NOTE — Telephone Encounter (Signed)
Patient left vm asking for antibiotics for possible sinus infection. Having congestion, facial pain, and cough.   Please schedule patient for virtual appt with any provider today, or Lesly Rubenstein if wants to wait til tomorrow. Thanks!

## 2019-05-18 ENCOUNTER — Telehealth: Payer: BC Managed Care – PPO | Admitting: Family Medicine

## 2019-08-18 DIAGNOSIS — J018 Other acute sinusitis: Secondary | ICD-10-CM | POA: Diagnosis not present

## 2019-08-18 DIAGNOSIS — Z20822 Contact with and (suspected) exposure to covid-19: Secondary | ICD-10-CM | POA: Diagnosis not present

## 2019-09-19 ENCOUNTER — Encounter: Payer: Self-pay | Admitting: Physician Assistant

## 2019-09-26 ENCOUNTER — Encounter: Payer: Self-pay | Admitting: Physician Assistant

## 2019-09-26 ENCOUNTER — Ambulatory Visit (INDEPENDENT_AMBULATORY_CARE_PROVIDER_SITE_OTHER): Payer: BC Managed Care – PPO | Admitting: Physician Assistant

## 2019-09-26 VITALS — BP 121/79 | HR 62 | Ht 60.0 in | Wt 154.0 lb

## 2019-09-26 DIAGNOSIS — F410 Panic disorder [episodic paroxysmal anxiety] without agoraphobia: Secondary | ICD-10-CM

## 2019-09-26 DIAGNOSIS — F329 Major depressive disorder, single episode, unspecified: Secondary | ICD-10-CM

## 2019-09-26 DIAGNOSIS — F419 Anxiety disorder, unspecified: Secondary | ICD-10-CM

## 2019-09-26 DIAGNOSIS — F411 Generalized anxiety disorder: Secondary | ICD-10-CM

## 2019-09-26 DIAGNOSIS — F32A Depression, unspecified: Secondary | ICD-10-CM

## 2019-09-26 MED ORDER — ALPRAZOLAM 0.5 MG PO TABS: 0.5000 mg | ORAL_TABLET | Freq: Two times a day (BID) | ORAL | 1 refills | Status: AC | PRN

## 2019-09-26 NOTE — Progress Notes (Signed)
Subjective:    Patient ID: Ann Black, female    DOB: 11-03-1990, 29 y.o.   MRN: [id number]  HPI  Patient is a 29 year old female with anxiety, depression and anxiety attacks who presents to the clinic for follow-up.  Patient seeks something to take for anxiety attacks.  She has tried daily medications that just do not seem to be effective.  She still has the attacks when she is on daily medication.  She feels like for the most part she is able to manage her anxiety and depression but want something for as needed.  Hydroxyzine has helped with sleep but has her to sleep during an acute panic attack.  She is having these episodes about once a week. .. Active Ambulatory Problems    Diagnosis Date Noted  . 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 Anxiety and depression 12/27/2012  . Hyperlipidemia 03/28/2013  . Proteinuria 04/19/2014  . Left breast mass 01/20/2015  . Nephrolith 08/08/2015  . Left flexor carpi radialis rupture 08/08/2015  . Scar 08/15/2015  . 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 Hyperreflexia 11/28/2017  . Generalized anxiety disorder with panic attacks 06/14/2018  . Stress reaction 06/14/2018  . COVID-19 virus infection 01/10/2019   Resolved Ambulatory Problems    Diagnosis Date Noted  . C. difficile diarrhea 10/17/2012  . Diarrhea 08/31/2013   Past Medical History:  Diagnosis Date  . Helicobacter pylori (H. pylori)      Review of Systems See HPI.     Objective:   Physical Exam Vitals reviewed.  Constitutional:      Appearance: Normal appearance.  Cardiovascular:     Rate and Rhythm: Normal rate.     Pulses: Normal pulses.  Pulmonary:     Effort: Pulmonary effort is normal.  Neurological:     General: No focal deficit present.     Mental Status: She is alert and oriented to person, place, and time.  Psychiatric:        Mood and Affect: Mood normal.       .. GAD 7 : Generalized Anxiety Score 09/26/2019 09/19/2018 06/12/2018  01/18/2018  Nervous, Anxious, on Edge 2 2 1 3   Control/stop worrying 1 2 1 3   Worry too much - different things 1 2 1 3   Trouble relaxing 1 2 1 3   Restless 1 2 1 3   Easily annoyed or irritable 2 3 2 3   Afraid - awful might happen 1 1 0 3  Total GAD 7 Score 9 14 7 21   Anxiety Difficulty Somewhat difficult Very difficult Extremely difficult Very difficult    . Depression screen [facility] 2/9 09/26/2019 09/19/2018 09/19/2018 06/12/2018 01/18/2018  Decreased Interest 0 0 0 0 0  Down, Depressed, Hopeless 0 0 0 0 0  PHQ - 2 Score 0 0 0 0 0  Altered sleeping 2 1 - 1 2  Tired, decreased energy 1 1 - 1 1  Change in appetite 0 0 - 0 0  Feeling bad or failure about yourself  0 1 - 0 1  Trouble concentrating 1 0 - 1 1  Moving slowly or fidgety/restless 0 0 - 1 0  Suicidal thoughts 0 0 - 0 0  PHQ-9 Score 4 3 - 4 5  Difficult doing work/chores Not difficult at all Somewhat difficult - Very difficult -       Assessment & Plan:  [name] for anxiety.  Diagnoses and all orders for this visit:  Generalized anxiety disorder with panic  attacks -     ALPRAZolam (XANAX) 0.5 MG tablet; Take 1 tablet (0.5 mg total) by mouth 2 (two) times daily as needed for anxiety.  Anxiety and depression   Overall anxiety symptoms are controlled. She does have acute anxiety symptoms about once a week. She would like something for then. She uses hydroxyzine for sleep. No benefit with medications daily.  Xanax was given.  Discussed in long detail about the risk of dependency and abuse.  Will give small quantity to use sparingly.  Discussed if using this every day will need to discuss a long-term medication.  Encouraged certain CBT therapies to help with anxiety.   Per patient she is having her annual woman exam in next month.

## 2019-10-11 DIAGNOSIS — Z3041 Encounter for surveillance of contraceptive pills: Secondary | ICD-10-CM | POA: Diagnosis not present

## 2019-10-11 DIAGNOSIS — Z01419 Encounter for gynecological examination (general) (routine) without abnormal findings: Secondary | ICD-10-CM | POA: Diagnosis not present

## 2019-10-11 DIAGNOSIS — Z6829 Body mass index (BMI) 29.0-29.9, adult: Secondary | ICD-10-CM | POA: Diagnosis not present

## 2019-10-11 LAB — HM PAP SMEAR: HM Pap smear: NORMAL

## 2019-12-25 DIAGNOSIS — B9689 Other specified bacterial agents as the cause of diseases classified elsewhere: Secondary | ICD-10-CM | POA: Diagnosis not present

## 2019-12-25 DIAGNOSIS — N760 Acute vaginitis: Secondary | ICD-10-CM | POA: Diagnosis not present

## 2019-12-25 DIAGNOSIS — Z113 Encounter for screening for infections with a predominantly sexual mode of transmission: Secondary | ICD-10-CM | POA: Diagnosis not present

## 2019-12-25 DIAGNOSIS — N898 Other specified noninflammatory disorders of vagina: Secondary | ICD-10-CM | POA: Diagnosis not present

## 2020-02-13 ENCOUNTER — Telehealth: Payer: Self-pay | Admitting: Neurology

## 2020-02-13 NOTE — Telephone Encounter (Signed)
Patient left vm stating she had an episode of hypoglycemia, has never happened in the past, had to take oral glucose and eat. She wants to know if she needs blood work.

## 2020-02-13 NOTE — Telephone Encounter (Signed)
Called patient, no answer, VM not set up yet.

## 2020-02-15 NOTE — Telephone Encounter (Signed)
Patient scheduled.

## 2020-02-18 ENCOUNTER — Encounter: Payer: Self-pay | Admitting: Physician Assistant

## 2020-02-18 ENCOUNTER — Ambulatory Visit (INDEPENDENT_AMBULATORY_CARE_PROVIDER_SITE_OTHER): Payer: BC Managed Care – PPO | Admitting: Physician Assistant

## 2020-02-18 VITALS — BP 131/73 | HR 72 | Ht 60.0 in | Wt 151.0 lb

## 2020-02-18 DIAGNOSIS — E162 Hypoglycemia, unspecified: Secondary | ICD-10-CM | POA: Diagnosis not present

## 2020-02-18 DIAGNOSIS — R454 Irritability and anger: Secondary | ICD-10-CM

## 2020-02-18 DIAGNOSIS — F419 Anxiety disorder, unspecified: Secondary | ICD-10-CM | POA: Diagnosis not present

## 2020-02-18 DIAGNOSIS — F411 Generalized anxiety disorder: Secondary | ICD-10-CM

## 2020-02-18 DIAGNOSIS — F410 Panic disorder [episodic paroxysmal anxiety] without agoraphobia: Secondary | ICD-10-CM

## 2020-02-18 DIAGNOSIS — F32A Depression, unspecified: Secondary | ICD-10-CM

## 2020-02-18 LAB — POCT GLYCOSYLATED HEMOGLOBIN (HGB A1C): Hemoglobin A1C: 4.7 % (ref 4.0–5.6)

## 2020-02-18 MED ORDER — SERTRALINE HCL 50 MG PO TABS: 50.0000 mg | ORAL_TABLET | Freq: Every day | ORAL | 1 refills | Status: DC

## 2020-02-18 NOTE — Progress Notes (Signed)
Subjective:    Patient ID: Ann Black, female    DOB: 28-Mar-1990, 29 y.o.   MRN: [id number]  HPI  Pt is a 29 yo female who presents to the clinic with concerns of low blood sugar.  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 She ate and felt better. She never had episode like this before. She has not had one since. She had had a lot of sugar that day. With 2 coffees with cream, chicken waffles with syrup.   Patient remains concerned with her mood.  She is having trouble with her partner.  She got a verbal warning at work due to an anger outburst not directed to her partner but in general.  Her dad is on Zoloft.  She wonders if that could be a good fit for her.  She has tried multiple medications in the past. No SI/HC.   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 difficile diarrhea 10/17/2012   Diarrhea 08/31/2013   COVID-19 virus infection 01/10/2019   Past Medical History:  Diagnosis Date   Helicobacter pylori (H. pylori)      Review of Systems  All other systems reviewed and are negative.      Objective:   Physical Exam Vitals reviewed.  Constitutional:      Appearance: Normal appearance.  HENT:     Head: Normocephalic.  Neurological:     General: No focal  deficit present.     Mental Status: She is alert and oriented to person, place, and time.  Psychiatric:        Mood and Affect: Mood normal.        Behavior: Behavior normal.    .. Depression screen [facility] 2/9 09/26/2019 09/19/2018 09/19/2018 06/12/2018 01/18/2018  Decreased Interest 0 0 0 0 0  Down, Depressed, Hopeless 0 0 0 0 0  PHQ - 2 Score 0 0 0 0 0  Altered sleeping 2 1 - 1 2  Tired, decreased energy 1 1 - 1 1  Change in appetite 0 0 - 0 0  Feeling bad or failure about yourself  0 1 - 0 1  Trouble concentrating 1 0 - 1 1  Moving slowly or fidgety/restless 0 0 - 1 0  Suicidal thoughts 0 0 - 0 0  PHQ-9 Score 4 3 - 4 5  Difficult doing work/chores Not difficult at all Somewhat difficult - Very difficult -   .. GAD 7 : Generalized Anxiety Score 09/26/2019 09/19/2018 06/12/2018 01/18/2018  Nervous, Anxious, on Edge 2 2 1 3   Control/stop worrying 1 2 1 3   Worry too much - different things 1 2 1 3   Trouble relaxing 1 2 1 3   Restless 1 2 1 3   Easily annoyed or irritable 2 3 2 3   Afraid -  awful might happen 1 1 0 3  Total GAD 7 Score 9 14 7 21   Anxiety Difficulty Somewhat difficult Very difficult Extremely difficult Very difficult           Assessment & Plan:  Marland KitchenDiagnoses and all orders for this visit:  Low blood sugar -     POCT glycosylated hemoglobin (Hb A1C)  Irritable -     sertraline (ZOLOFT) 50 MG tablet; Take 1 tablet (50 mg total) by mouth daily.  Hypoglycemia  Outbursts of anger -     sertraline (ZOLOFT) 50 MG tablet; Take 1 tablet (50 mg total) by mouth daily.  Anxiety and depression -     sertraline (ZOLOFT) 50 MG tablet; Take 1 tablet (50 mg total) by mouth daily.  Generalized anxiety disorder with panic attacks -     sertraline (ZOLOFT) 50 MG tablet; Take 1 tablet (50 mg total) by mouth daily.   .. Results for orders placed or performed in visit on 02/18/20  HM PAP SMEAR  Result Value Ref Range   HM Pap smear normal   POCT glycosylated hemoglobin (Hb  A1C)  Result Value Ref Range   Hemoglobin A1C 4.7 4.0 - 5.6 %   HbA1c POC (<> result, manual entry)     HbA1c, POC (prediabetic range)     HbA1c, POC (controlled diabetic range)     A1C is great.  Discussed hypoglycemia. HO given. Get relion glucometer to check symptoms again. Try to eat small frequent meals/snacks. Too much sugar at one time can make hypoglycemia more likely. Protein with sugars are very helpful as well.   Discussed mood. Started zoloft. Dad has done well on this medication. Discussed tools to manage stress and anger outbursts. Would love for patient to consider counseling.

## 2020-02-18 NOTE — Patient Instructions (Signed)
Hypoglycemia Hypoglycemia occurs when the level of sugar (glucose) in the blood is too low. Hypoglycemia can happen in people who do or do not have diabetes. It can develop quickly, and it can be a medical emergency. For most people with diabetes, a blood glucose level below 70 mg/dL (3.9 mmol/L) is considered hypoglycemia. Glucose is a type of sugar that provides the body's main source of energy. Certain hormones (insulin and glucagon) control the level of glucose in the blood. Insulin lowers blood glucose, and glucagon raises blood glucose. Hypoglycemia can result from having too much insulin in the bloodstream, or from not eating enough food that contains glucose. You may also have reactive hypoglycemia, which happens within 4 hours after eating a meal. What are the causes? Hypoglycemia occurs most often in people who have diabetes and may be caused by:  Diabetes medicine.  Not eating enough, or not eating often enough.  Increased physical activity.  Drinking alcohol on an empty stomach. If you do not have diabetes, hypoglycemia may be caused by:  A tumor in the pancreas.  Not eating enough, or not eating for long periods at a time (fasting).  A severe infection or illness.  Certain medicines. What increases the risk? Hypoglycemia is more likely to develop in:  People who have diabetes and take medicines to lower blood glucose.  People who abuse alcohol.  People who have a severe illness. What are the signs or symptoms? Mild symptoms Mild hypoglycemia may not cause any symptoms. If you do have symptoms, they may include:  Hunger.  Anxiety.  Sweating and feeling clammy.  Dizziness or feeling light-headed.  Sleepiness.  Nausea.  Increased heart rate.  Headache.  Blurry vision.  Irritability.  Tingling or numbness around the mouth, lips, or tongue.  A change in coordination.  Restless sleep. Moderate symptoms Moderate hypoglycemia can cause:  Mental  confusion and poor judgment.  Behavior changes.  Weakness.  Irregular heartbeat. Severe symptoms Severe hypoglycemia is a medical emergency. It can cause:  Fainting.  Seizures.  Loss of consciousness (coma).  Death. How is this diagnosed? Hypoglycemia is diagnosed with a blood test to measure your blood glucose level. This blood test is done while you are having symptoms. Your health care provider may also do a physical exam and review your medical history. How is this treated? This condition can often be treated by immediately eating or drinking something that contains sugar, such as:  Fruit juice, 4-6 oz (120-150 mL).  Regular soda (not diet soda), 4-6 oz (120-150 mL).  Low-fat milk, 4 oz (120 mL).  Several pieces of hard candy.  Sugar or honey, 1 Tbsp (15 mL). Treating hypoglycemia if you have diabetes If you are alert and able to swallow safely, follow the 15:15 rule:  Take 15 grams of a rapid-acting carbohydrate. Talk with your health care provider about how much you should take.  Rapid-acting options include: ? Glucose pills (take 15 grams). ? 6-8 pieces of hard candy. ? 4-6 oz (120-150 mL) of fruit juice. ? 4-6 oz (120-150 mL) of regular (not diet) soda. ? 1 Tbsp (15 mL) honey or sugar.  Check your blood glucose 15 minutes after you take the carbohydrate.  If the repeat blood glucose level is still at or below 70 mg/dL (3.9 mmol/L), take 15 grams of a carbohydrate again.  If your blood glucose level does not increase above 70 mg/dL (3.9 mmol/L) after 3 tries, seek emergency medical care.  After your blood glucose level returns   to normal, eat a meal or a snack within 1 hour.  Treating severe hypoglycemia Severe hypoglycemia is when your blood glucose level is at or below 54 mg/dL (3 mmol/L). Severe hypoglycemia is a medical emergency. Get medical help right away. If you have severe hypoglycemia and you cannot eat or drink, you may need an injection of  glucagon. A family member or close friend should learn how to check your blood glucose and how to give you a glucagon injection. Ask your health care provider if you need to have an emergency glucagon injection kit available. Severe hypoglycemia may need to be treated in a hospital. The treatment may include getting glucose through an IV. You may also need treatment for the cause of your hypoglycemia. Follow these instructions at home:  General instructions  Take over-the-counter and prescription medicines only as told by your health care provider.  Monitor your blood glucose as told by your health care provider.  Limit alcohol intake to no more than 1 drink a day for nonpregnant women and 2 drinks a day for men. One drink equals 12 oz of beer (355 mL), 5 oz of wine (148 mL), or 1 oz of hard liquor (44 mL).  Keep all follow-up visits as told by your health care provider. This is important. If you have diabetes:  Always have a rapid-acting carbohydrate snack with you to treat low blood glucose.  Follow your diabetes management plan as directed. Make sure you: ? Know the symptoms of hypoglycemia. It is important to treat it right away to prevent it from becoming severe. ? Take your medicines as directed. ? Follow your exercise plan. ? Follow your meal plan. Eat on time, and do not skip meals. ? Check your blood glucose as often as directed. Always check before and after exercise. ? Follow your sick day plan whenever you cannot eat or drink normally. Make this plan in advance with your health care provider.  Share your diabetes management plan with people in your workplace, school, and household.  Check your urine for ketones when you are ill and as told by your health care provider.  Carry a medical alert card or wear medical alert jewelry. Contact a health care provider if:  You have problems keeping your blood glucose in your target range.  You have frequent episodes of  hypoglycemia. Get help right away if:  You continue to have hypoglycemia symptoms after eating or drinking something containing glucose.  Your blood glucose is at or below 54 mg/dL (3 mmol/L).  You have a seizure.  You faint. These symptoms may represent a serious problem that is an emergency. Do not wait to see if the symptoms will go away. Get medical help right away. Call your local emergency services (911 in the [place] Summary  Hypoglycemia occurs when the level of sugar (glucose) in the blood is too low.  Hypoglycemia can happen in people who do or do not have diabetes. It can develop quickly, and it can be a medical emergency.  Make sure you know the symptoms of hypoglycemia and how to treat it.  Always have a rapid-acting carbohydrate snack with you to treat low blood sugar. This information is not intended to replace advice given to you by your health care provider. Make sure you discuss any questions you have with your health care provider. Document Revised: 08/15/2017 Document Reviewed: 03/28/2015 Elsevier Patient Education  2020 [employer]  

## 2020-02-25 ENCOUNTER — Encounter: Payer: Self-pay | Admitting: Physician Assistant

## 2020-02-25 DIAGNOSIS — E162 Hypoglycemia, unspecified: Secondary | ICD-10-CM | POA: Insufficient documentation

## 2020-02-25 DIAGNOSIS — R454 Irritability and anger: Secondary | ICD-10-CM | POA: Insufficient documentation

## 2020-03-11 ENCOUNTER — Other Ambulatory Visit: Payer: Self-pay | Admitting: Physician Assistant

## 2020-03-11 DIAGNOSIS — F32A Depression, unspecified: Secondary | ICD-10-CM

## 2020-03-11 DIAGNOSIS — R454 Irritability and anger: Secondary | ICD-10-CM

## 2020-03-11 DIAGNOSIS — F410 Panic disorder [episodic paroxysmal anxiety] without agoraphobia: Secondary | ICD-10-CM

## 2020-03-11 DIAGNOSIS — F419 Anxiety disorder, unspecified: Secondary | ICD-10-CM

## 2020-03-11 DIAGNOSIS — F411 Generalized anxiety disorder: Secondary | ICD-10-CM

## 2020-03-26 ENCOUNTER — Encounter: Payer: BC Managed Care – PPO | Admitting: Physician Assistant

## 2020-04-17 ENCOUNTER — Other Ambulatory Visit: Payer: Self-pay | Admitting: Physician Assistant

## 2020-04-17 DIAGNOSIS — F32A Depression, unspecified: Secondary | ICD-10-CM

## 2020-04-17 DIAGNOSIS — F411 Generalized anxiety disorder: Secondary | ICD-10-CM

## 2020-04-17 DIAGNOSIS — F419 Anxiety disorder, unspecified: Secondary | ICD-10-CM

## 2020-04-17 DIAGNOSIS — F4329 Adjustment disorder with other symptoms: Secondary | ICD-10-CM

## 2020-06-17 ENCOUNTER — Other Ambulatory Visit: Payer: Self-pay | Admitting: Physician Assistant

## 2020-06-17 DIAGNOSIS — R454 Irritability and anger: Secondary | ICD-10-CM

## 2020-06-17 DIAGNOSIS — F32A Depression, unspecified: Secondary | ICD-10-CM

## 2020-06-17 DIAGNOSIS — F410 Panic disorder [episodic paroxysmal anxiety] without agoraphobia: Secondary | ICD-10-CM

## 2020-08-05 ENCOUNTER — Encounter: Payer: Self-pay | Admitting: Physician Assistant

## 2020-08-06 ENCOUNTER — Telehealth (INDEPENDENT_AMBULATORY_CARE_PROVIDER_SITE_OTHER): Payer: BC Managed Care – PPO | Admitting: Physician Assistant

## 2020-08-06 ENCOUNTER — Encounter: Payer: Self-pay | Admitting: Physician Assistant

## 2020-08-06 VITALS — HR 70 | Temp 97.2°F | Ht 60.0 in | Wt 151.0 lb

## 2020-08-06 DIAGNOSIS — F419 Anxiety disorder, unspecified: Secondary | ICD-10-CM

## 2020-08-06 DIAGNOSIS — F430 Acute stress reaction: Secondary | ICD-10-CM | POA: Diagnosis not present

## 2020-08-06 DIAGNOSIS — F32A Depression, unspecified: Secondary | ICD-10-CM | POA: Diagnosis not present

## 2020-08-06 MED ORDER — SERTRALINE HCL 100 MG PO TABS: 100.0000 mg | ORAL_TABLET | Freq: Every day | ORAL | 1 refills | Status: DC

## 2020-08-06 NOTE — Progress Notes (Signed)
Patient ID: Ann Black, female   DOB: Apr 16, 1990, 30 y.o.   MRN: [id number] .Marland KitchenVirtual Visit via Telephone Note  I connected with Ann Black on 08/06/20 at 10:30 AM EDT by telephone and verified that I am speaking with the correct person using two identifiers.  Location: Patient: home  Provider: clinic  .Marland KitchenParticipating in visit:  Patient: Ann Black Provider: Tandy Gaw PA-C   I discussed the limitations, risks, security and privacy concerns of performing an evaluation and management service by telephone and the availability of in person appointments. I also discussed with the patient that there may be a patient responsible charge related to this service. The patient expressed understanding and agreed to proceed.   History of Present Illness: Patient is a 30 year old female with anxiety and depression who calls into the clinic to discuss recent acute stressors.  Her boyfriend who was supposed to have a routine procedure last week ended up having a perforated esophagus and was in intensive care and now home with lots of caregiving needs.  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  Her disabled dog injured another leg.  She feels completely overwhelmed and like she cannot take care of people right now.  She denies any suicidal thoughts or homicidal idealizations.  The Zoloft has been working really well for her mood.  She just feels like she needs some days off to stabilize.  She will need a work note.   .. Active Ambulatory Problems    Diagnosis Date Noted  . 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 Anxiety and depression 12/27/2012  . Hyperlipidemia 03/28/2013  . Proteinuria 04/19/2014  . Left breast mass 01/20/2015  . Nephrolith 08/08/2015  . Left flexor carpi radialis rupture 08/08/2015  . Scar 08/15/2015  . 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 Hyperreflexia 11/28/2017  . Generalized anxiety disorder with panic attacks  06/14/2018  . Stress reaction 06/14/2018  . Hypoglycemia 02/25/2020  . Irritable 02/25/2020   Resolved Ambulatory Problems    Diagnosis Date Noted  . C. difficile diarrhea 10/17/2012  . Diarrhea 08/31/2013  . COVID-19 virus infection 01/10/2019   Past Medical History:  Diagnosis Date  . Helicobacter pylori (H. pylori)      Observations/Objective: No acute distress Normal mood No labored breathing  .[name] Today's Vitals   08/06/20 1023  Pulse: 70  Temp: (!) 97.2 F (36.2 C)  TempSrc: Oral  Weight: 151 lb (68.5 kg)  Height: 5' (1.524 m)   Body mass index is 29.49 kg/m.  .. Depression screen [facility] 2/9 08/06/2020 09/26/2019 09/19/2018 09/19/2018 06/12/2018  Decreased Interest 1 0 0 0 0  Down, Depressed, Hopeless 1 0 0 0 0  PHQ - 2 Score 2 0 0 0 0  Altered sleeping 3 2 1  - 1  Tired, decreased energy 1 1 1  - 1  Change in appetite 0 0 0 - 0  Feeling bad or failure about yourself  0 0 1 - 0  Trouble concentrating 1 1 0 - 1  Moving slowly or fidgety/restless 0 0 0 - 1  Suicidal thoughts 0 0 0 - 0  PHQ-9 Score 7 4 3  - 4  Difficult doing work/chores Somewhat difficult Not difficult at all Somewhat difficult - Very difficult   . GAD 7 : Generalized Anxiety Score 08/06/2020 09/26/2019 09/19/2018 06/12/2018  Nervous, Anxious, on Edge 3 2 2 1   Control/stop worrying 3 1 2 1   Worry too much - different things 3 1 2  1  Trouble relaxing 3 1 2 1   Restless 1 1 2 1   Easily annoyed or irritable 3 2 3 2   Afraid - awful might happen 1 1 1  0  Total GAD 7 Score 17 9 14 7   Anxiety Difficulty Somewhat difficult Somewhat difficult Very difficult Extremely difficult       Assessment and Plan:  Daren was seen today for depression and anxiety.  Diagnoses and all orders for this visit:  Stress reaction  Anxiety and depression -     sertraline (ZOLOFT) 100 MG tablet; Take 1 tablet (100 mg total) by mouth daily.   Written out of work to stabilize mood through next tuesday. Increase zoloft  to 100mg  daily. Xanax as needed very sparingly. Consider counseling to hand stress of caregiving. Pt has a friend she is talking to about the extra stress.    Follow Up Instructions:    I discussed the assessment and treatment plan with the patient. The patient was provided an opportunity to ask questions and all were answered. The patient agreed with the plan and demonstrated an understanding of the instructions.   The patient was advised to call back or seek an in-person evaluation if the symptoms worsen or if the condition fails to improve as anticipated.  I provided 20 minutes of non-face-to-face time during this encounter.   , PA-C

## 2020-08-06 NOTE — Progress Notes (Signed)
PHQ9 (7) -GAD7 (17) completed.   To discuss below from mychart:   Good afternoon Erick Murin,  I am messaging you in regards to possibly obtaining a doctors note for work. Recently, my boyfriend was in the ICU following a traumatic medical procedure.he came home from the hospital Friday, and his family needs help taking care of him. Also, my dog has injured his leg, so he's not able to be left at home alone without care either. This has all taken a toll on my mental health, as this has all been unexpected, and I feel that it is hard to be at work while my mind is other places.and given my responsibilities at work, I don't want my patient care to be inadequate.  The reason I am messaging you for a doctors note is that they have a strict call-out policy, and though I have only called out twice during this, I anticipate them requesting a note for my absences. I am happy to make an appointment with you to discuss things further, if necessary.   Thank you for your time and understanding,  Aria Jarrard

## 2020-09-24 DIAGNOSIS — R5383 Other fatigue: Secondary | ICD-10-CM | POA: Diagnosis not present

## 2020-09-24 DIAGNOSIS — F32A Depression, unspecified: Secondary | ICD-10-CM | POA: Diagnosis not present

## 2020-09-24 DIAGNOSIS — N921 Excessive and frequent menstruation with irregular cycle: Secondary | ICD-10-CM | POA: Diagnosis not present

## 2020-09-28 ENCOUNTER — Other Ambulatory Visit: Payer: Self-pay | Admitting: Physician Assistant

## 2020-09-28 DIAGNOSIS — F411 Generalized anxiety disorder: Secondary | ICD-10-CM

## 2020-09-28 DIAGNOSIS — R454 Irritability and anger: Secondary | ICD-10-CM

## 2020-09-28 DIAGNOSIS — F32A Depression, unspecified: Secondary | ICD-10-CM

## 2020-09-28 DIAGNOSIS — F419 Anxiety disorder, unspecified: Secondary | ICD-10-CM

## 2020-09-28 DIAGNOSIS — F410 Panic disorder [episodic paroxysmal anxiety] without agoraphobia: Secondary | ICD-10-CM

## 2021-07-26 ENCOUNTER — Other Ambulatory Visit: Payer: Self-pay | Admitting: Physician Assistant

## 2021-07-26 DIAGNOSIS — F32A Depression, unspecified: Secondary | ICD-10-CM

## 2021-07-26 DIAGNOSIS — F4329 Adjustment disorder with other symptoms: Secondary | ICD-10-CM

## 2021-07-26 DIAGNOSIS — F411 Generalized anxiety disorder: Secondary | ICD-10-CM

## 2021-08-26 ENCOUNTER — Other Ambulatory Visit: Payer: Self-pay | Admitting: Physician Assistant

## 2021-08-26 DIAGNOSIS — F419 Anxiety disorder, unspecified: Secondary | ICD-10-CM

## 2021-08-26 DIAGNOSIS — F411 Generalized anxiety disorder: Secondary | ICD-10-CM

## 2021-08-26 DIAGNOSIS — F4329 Adjustment disorder with other symptoms: Secondary | ICD-10-CM

## 2021-10-16 ENCOUNTER — Other Ambulatory Visit: Payer: Self-pay | Admitting: Physician Assistant

## 2021-10-16 ENCOUNTER — Encounter: Payer: Self-pay | Admitting: Physician Assistant

## 2021-10-16 ENCOUNTER — Ambulatory Visit: Payer: Managed Care, Other (non HMO)

## 2021-10-16 ENCOUNTER — Ambulatory Visit (INDEPENDENT_AMBULATORY_CARE_PROVIDER_SITE_OTHER): Payer: Managed Care, Other (non HMO) | Admitting: Physician Assistant

## 2021-10-16 VITALS — BP 137/84 | HR 71 | Temp 98.1°F | Ht 60.0 in | Wt 155.0 lb

## 2021-10-16 DIAGNOSIS — Z87442 Personal history of urinary calculi: Secondary | ICD-10-CM

## 2021-10-16 DIAGNOSIS — M545 Low back pain, unspecified: Secondary | ICD-10-CM

## 2021-10-16 DIAGNOSIS — R109 Unspecified abdominal pain: Secondary | ICD-10-CM

## 2021-10-16 DIAGNOSIS — R319 Hematuria, unspecified: Secondary | ICD-10-CM

## 2021-10-16 LAB — POCT URINALYSIS DIP (CLINITEK)
Bilirubin, UA: NEGATIVE
Glucose, UA: NEGATIVE mg/dL
Ketones, POC UA: NEGATIVE mg/dL
Leukocytes, UA: NEGATIVE
Nitrite, UA: NEGATIVE
POC PROTEIN,UA: NEGATIVE
Spec Grav, UA: 1.02 (ref 1.010–1.025)
Urobilinogen, UA: 0.2 E.U./dL
pH, UA: 5.5 (ref 5.0–8.0)

## 2021-10-16 MED ORDER — HYDROCODONE-ACETAMINOPHEN 5-325 MG PO TABS: 1.0000 | ORAL_TABLET | Freq: Three times a day (TID) | ORAL | 0 refills | Status: AC | PRN

## 2021-10-16 MED ORDER — CYCLOBENZAPRINE HCL 10 MG PO TABS: 10.0000 mg | ORAL_TABLET | Freq: Three times a day (TID) | ORAL | 0 refills | Status: AC | PRN

## 2021-10-16 MED ORDER — TAMSULOSIN HCL 0.4 MG PO CAPS: 0.4000 mg | ORAL_CAPSULE | Freq: Every day | ORAL | 0 refills | Status: AC

## 2021-10-16 MED ORDER — IBUPROFEN 800 MG PO TABS: 800.0000 mg | ORAL_TABLET | Freq: Three times a day (TID) | ORAL | 0 refills | Status: AC | PRN

## 2021-10-16 MED ORDER — KETOROLAC TROMETHAMINE 60 MG/2ML IM SOLN
60.0000 mg | Freq: Once | INTRAMUSCULAR | Status: AC
  Administered 2021-10-16: 60 mg via INTRAMUSCULAR

## 2021-10-16 NOTE — Progress Notes (Signed)
Acute Office Visit  Subjective:     Patient ID: Ann Black, female    DOB: 1991-01-28, 31 y.o.   MRN: [id number]  Chief Complaint  Patient presents with   Back Pain    HPI Patient is in today for bilateral lower back and flank pain since Saturday night/Sunday morning. Sunday morning she was waken up due to pain in bilateral lower flank. It has improved some but still persistent. Denies any dysuria or urinary frequency. No recent lumbar injury. No fever, chills, nausea or vomiting. Pain is 5/10. She has twinges that are more painful. She has hx of kidney stone and had to have stent placed at one time. She is drinking lots of water but not getting better.   .. 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 difficile diarrhea 10/17/2012   Diarrhea 08/31/2013   COVID-19 virus infection 01/10/2019   Past Medical History:  Diagnosis Date   Helicobacter pylori (H. pylori)       ROS  See HPI.     Objective:    BP 137/84   Pulse 71   Temp 98.1 F (36.7 C) (Oral)   Ht 5' (1.524 m)   Wt 155 lb (70.3 kg)   SpO2 100%   BMI 30.27 kg/m  BP Readings from Last 3 Encounters:  10/16/21 137/84  02/18/20 131/73  09/26/19 121/79      Physical Exam Vitals reviewed.  Constitutional:      Appearance: Normal appearance.  HENT:     Head: Normocephalic.  Cardiovascular:     Rate and Rhythm: Normal rate.     Pulses: Normal pulses.     Heart  sounds: Normal heart sounds.  Pulmonary:     Effort: Pulmonary effort is normal.     Breath sounds: Normal breath sounds.  Abdominal:     General: There is no distension.     Palpations: Abdomen is soft. There is no mass.     Tenderness: There is no abdominal tenderness. There is no right CVA tenderness, left CVA tenderness, guarding or rebound.     Comments: Tenderness over bilateral lateral lower back and flank to palpation.   Musculoskeletal:     Right lower leg: No edema.     Left lower leg: No edema.     Comments: NROM at waist Negative SLR, bilaterally.   Neurological:     General: No focal deficit present.     Mental Status: She is alert.  Psychiatric:        Mood and Affect: Mood normal.     Results for orders placed or performed in visit on 10/16/21  POCT URINALYSIS DIP (CLINITEK)  Result Value Ref Range   Color, UA yellow yellow   Clarity, UA clear clear   Glucose, UA negative negative mg/dL   Bilirubin, UA negative negative   Ketones, POC UA negative negative mg/dL   Spec Grav, UA 1.020 1.010 - 1.025   Blood, UA trace-lysed (A)  negative   pH, UA 5.5 5.0 - 8.0   POC PROTEIN,UA negative negative, trace   Urobilinogen, UA 0.2 0.2 or 1.0 E.U./dL   Nitrite, UA Negative Negative   Leukocytes, UA Negative Negative        Assessment & Plan:  Marland KitchenMarland KitchenChelsey was seen today for back pain.  Diagnoses and all orders for this visit:  Bilateral flank pain -     POCT URINALYSIS DIP (CLINITEK) -     Urine Culture -     ibuprofen (ADVIL) 800 MG tablet; Take 1 tablet (800 mg total) by mouth every 8 (eight) hours as needed. -     tamsulosin (FLOMAX) 0.4 MG CAPS capsule; Take 1 capsule (0.4 mg total) by mouth daily. 30 minutes after meal -     HYDROcodone-acetaminophen (NORCO/VICODIN) 5-325 MG tablet; Take 1 tablet by mouth every 8 (eight) hours as needed for up to 5 days for moderate pain. -     CT RENAL STONE STUDY; Future -     ketorolac (TORADOL) injection 60 mg -      COMPLETE METABOLIC PANEL WITH GFR -     CBC w/Diff/Platelet  Acute bilateral low back pain without sciatica -     POCT URINALYSIS DIP (CLINITEK) -     Urine Culture -     ibuprofen (ADVIL) 800 MG tablet; Take 1 tablet (800 mg total) by mouth every 8 (eight) hours as needed. -     tamsulosin (FLOMAX) 0.4 MG CAPS capsule; Take 1 capsule (0.4 mg total) by mouth daily. 30 minutes after meal -     HYDROcodone-acetaminophen (NORCO/VICODIN) 5-325 MG tablet; Take 1 tablet by mouth every 8 (eight) hours as needed for up to 5 days for moderate pain. -     CT RENAL STONE STUDY; Future -     ketorolac (TORADOL) injection 60 mg -     COMPLETE METABOLIC PANEL WITH GFR -     CBC w/Diff/Platelet  History of kidney stones -     POCT URINALYSIS DIP (CLINITEK) -     Urine Culture -     ibuprofen (ADVIL) 800 MG tablet; Take 1 tablet (800 mg total) by mouth every 8 (eight) hours as needed. -     tamsulosin (FLOMAX) 0.4 MG CAPS capsule; Take 1 capsule (0.4 mg total) by mouth daily. 30 minutes after meal -     HYDROcodone-acetaminophen (NORCO/VICODIN) 5-325 MG tablet; Take 1 tablet by mouth every 8 (eight) hours as needed for up to 5 days for moderate pain. -     CT RENAL STONE STUDY; Future -     ketorolac (TORADOL) injection 60 mg -     COMPLETE METABOLIC PANEL WITH GFR -     CBC w/Diff/Platelet  Hematuria, unspecified type -     POCT URINALYSIS DIP (CLINITEK) -     Urine Culture -     ibuprofen (ADVIL) 800 MG tablet; Take 1 tablet (800 mg total) by mouth every 8 (eight) hours as needed. -     tamsulosin (FLOMAX) 0.4 MG CAPS capsule; Take 1 capsule (0.4 mg total) by mouth daily. 30 minutes after meal -     HYDROcodone-acetaminophen (NORCO/VICODIN) 5-325 MG tablet; Take 1 tablet by mouth every 8 (eight) hours as needed for up to 5 days for moderate pain. -     CT RENAL STONE STUDY; Future -     ketorolac (TORADOL) injection 60 mg -     COMPLETE METABOLIC PANEL WITH GFR -  CBC  w/Diff/Platelet   Concern for kidney stone PE negative for any red flags UA positive for blood Will culture Hx of kidney stones with needing stent placement CT scan ordered Toradol 60mg  IM given in office today Ibuprofen 800mg  TID Flomax daily for 15 days Norco for break through pain . PDMP reviewed during this encounter. No concerns on review Follow up as needed or if symptoms change or worsen   , PA-C

## 2021-10-16 NOTE — Patient Instructions (Signed)
Kidney Stones  Kidney stones are solid, rock-like deposits that form inside of the kidneys. The kidneys are a pair of organs that make urine. A kidney stone may form in a kidney and move into other parts of the urinary tract, including the tubes that connect the kidneys to the bladder (ureters), the bladder, and the tube that carries urine out of the body (urethra). As the stone moves through these areas, it can cause intense pain and block the flow of urine. Kidney stones are created when high levels of certain minerals are found in the urine. The stones are usually passed out of the body through urination, but in some cases, medical treatment may be needed to remove them. What are the causes? Kidney stones may be caused by: A condition in which certain glands produce too much parathyroid hormone (primary hyperparathyroidism), which causes too much calcium buildup in the blood. A buildup of uric acid crystals in the bladder (hyperuricosuria). Uric acid is a chemical that the body produces when you eat certain foods. It usually leaves the body in the urine. Narrowing (stricture) of one or both of the ureters. A kidney blockage that is present at birth (congenital obstruction). Past surgery on the kidney or the ureters. What increases the risk? The following factors may make you more likely to develop this condition: Having had a kidney stone in the past. Having a family history of kidney stones. Not drinking enough water. Eating a diet that is high in protein, salt (sodium), or sugar. Being overweight or obese. What are the signs or symptoms? Symptoms of a kidney stone may include: Pain in the side of the abdomen, right below the ribs (flank pain). Pain usually spreads (radiates) to the groin. Needing to urinate often or urgently. Painful urination. Blood in the urine (hematuria). Nausea. Vomiting. Fever and chills. How is this diagnosed? This condition may be diagnosed based on: Your  symptoms and medical history. A physical exam. Blood tests. Urine tests. These may be done before and after the stone passes out of your body through urination. Imaging tests, such as a CT scan, abdominal X-ray, or ultrasound. A procedure to examine the inside of the bladder (cystoscopy). How is this treated? Treatment for kidney stones depends on the size, location, and makeup of the stones. Kidney stones will often pass out of the body through urination. You may need to: Increase your fluid intake to help pass the stone. In some cases, you may be given fluids through an IV and may need to be monitored in the hospital. Take medicine for pain. Make changes in your diet to help prevent kidney stones from coming back. Sometimes, procedures are needed to remove a kidney stone. This may involve: A procedure to break up kidney stones using: A focused beam of light (laser therapy). Shock waves (extracorporeal shock wave lithotripsy). Surgery to remove kidney stones. This may be needed if you have severe pain or have stones that block your urinary tract. Follow these instructions at home: Medicines Take over-the-counter and prescription medicines only as told by your health care provider. Ask your health care provider if the medicine prescribed to you requires you to avoid driving or using heavy machinery. Eating and drinking Drink enough fluid to keep your urine pale yellow. You may be instructed to drink at least 8-10 glasses of water each day. This will help you pass the kidney stone. If directed, change your diet. This may include: Limiting how much sodium you eat. Eating more fruits   and vegetables. Limiting how much animal protein you eat. Animal proteins include red meat, poultry, fish, and eggs. Eating a normal amount of calcium (1,000-1,300 mg per day). Follow instructions from your health care provider about eating or drinking restrictions. General instructions Collect urine samples as  told by your health care provider. You may need to collect a urine sample: 24 hours after you pass the stone. 8-12 weeks after you pass the kidney stone, and every 6-12 months after that. Strain your urine every time you urinate, for as long as directed. Use the strainer that your health care provider recommends. Do not throw out the kidney stone after passing it. Keep the stone so it can be tested by your health care provider. Testing the makeup of your kidney stone may help prevent you from getting kidney stones in the future. Keep all follow-up visits. You may need follow-up X-rays or ultrasounds to make sure that your stone has passed. How is this prevented? To prevent another kidney stone: Drink enough fluid to keep your urine pale yellow. This is the best way to prevent kidney stones. Eat a healthy diet. Follow recommendations from your health care provider about foods to avoid. Recommendations vary depending on the type of kidney stone that you have. You may be instructed to eat a low-protein diet. Maintain a healthy weight. Where to find more information National Kidney Foundation (NKF): www.kidney.org [facility] ([facility]): www.urologyhealth.org Contact a health care provider if: You have pain that gets worse or does not get better with medicine. Get help right away if: You have a fever or chills. You develop severe pain. You develop new abdominal pain. You faint. You are unable to urinate. Summary Kidney stones are solid, rock-like deposits that form inside of the kidneys. Kidney stones can cause nausea, vomiting, blood in the urine, abdominal pain, and the urge to urinate often. Treatment for kidney stones depends on the size, location, and makeup of the stones. Kidney stones will often pass out of the body through urination. Kidney stones can be prevented by drinking enough fluids, eating a healthy diet, and maintaining a healthy weight. This information is not intended  to replace advice given to you by your health care provider. Make sure you discuss any questions you have with your health care provider. Document Revised: 06/03/2021 Document Reviewed: 06/03/2021 Elsevier Patient Education  2023 [employer]  

## 2021-10-16 NOTE — Progress Notes (Signed)
No acute findings on CT. NO STONES> This is low back pain and with being more lateral more muscular.  Do not fill flomax. I will send muscle relaxer to take as needed.  Consider icy hot patches and/or massage.  If you have a tens unit could use as well.  Low back exercise/stretches could be beneficial as well.  Follow up if any new symptoms or worsening.  Ok to take ibuprofen and as needed norco.

## 2021-10-17 LAB — COMPLETE METABOLIC PANEL WITH GFR
AG Ratio: 1.7 (calc) (ref 1.0–2.5)
ALT: 8 U/L (ref 6–29)
AST: 10 U/L (ref 10–30)
Albumin: 4 g/dL (ref 3.6–5.1)
Alkaline phosphatase (APISO): 49 U/L (ref 31–125)
BUN: 10 mg/dL (ref 7–25)
CO2: 23 mmol/L (ref 20–32)
Calcium: 9.1 mg/dL (ref 8.6–10.2)
Chloride: 106 mmol/L (ref 98–110)
Creat: 0.83 mg/dL (ref 0.50–0.97)
Globulin: 2.4 g/dL (calc) (ref 1.9–3.7)
Glucose, Bld: 85 mg/dL (ref 65–99)
Potassium: 4.3 mmol/L (ref 3.5–5.3)
Sodium: 138 mmol/L (ref 135–146)
Total Bilirubin: 0.6 mg/dL (ref 0.2–1.2)
Total Protein: 6.4 g/dL (ref 6.1–8.1)
eGFR: 97 mL/min/{1.73_m2} (ref >=60)

## 2021-10-17 LAB — CBC WITH DIFFERENTIAL/PLATELET
Absolute Monocytes: 595 cells/uL (ref 200–950)
Basophils Absolute: 37 cells/uL (ref 0–200)
Basophils Relative: 0.6 %
Eosinophils Absolute: 99 cells/uL (ref 15–500)
Eosinophils Relative: 1.6 %
HCT: 42.5 % (ref 35.0–45.0)
Hemoglobin: 14.2 g/dL (ref 11.7–15.5)
Lymphs Abs: 2697 cells/uL (ref 850–3900)
MCH: 31.2 pg (ref 27.0–33.0)
MCHC: 33.4 g/dL (ref 32.0–36.0)
MCV: 93.4 fL (ref 80.0–100.0)
MPV: 14.6 fL — ABNORMAL HIGH (ref 7.5–12.5)
Monocytes Relative: 9.6 %
Neutro Abs: 2771 cells/uL (ref 1500–7800)
Neutrophils Relative %: 44.7 %
Platelets: 216 10*3/uL (ref 140–400)
RBC: 4.55 10*6/uL (ref 3.80–5.10)
RDW: 12.2 % (ref 11.0–15.0)
Total Lymphocyte: 43.5 %
WBC: 6.2 10*3/uL (ref 3.8–10.8)

## 2021-10-18 LAB — URINE CULTURE
MICRO NUMBER:: 13771494
SPECIMEN QUALITY:: ADEQUATE

## 2021-10-19 ENCOUNTER — Ambulatory Visit: Payer: BC Managed Care – PPO | Admitting: Physician Assistant

## 2021-10-19 ENCOUNTER — Encounter: Payer: Self-pay | Admitting: Physician Assistant

## 2021-10-19 ENCOUNTER — Other Ambulatory Visit: Payer: Self-pay | Admitting: Physician Assistant

## 2021-10-19 MED ORDER — FLUCONAZOLE 150 MG PO TABS: 150.0000 mg | ORAL_TABLET | Freq: Once | ORAL | 0 refills | Status: AC

## 2021-10-19 MED ORDER — CEPHALEXIN 500 MG PO CAPS: 500.0000 mg | ORAL_CAPSULE | Freq: Two times a day (BID) | ORAL | 0 refills | Status: AC

## 2021-10-19 NOTE — Progress Notes (Signed)
There was some strep found in urine upon culture. It is best treated with penicillin and on your med list I see diarrhea to amoxicillin. Thoughts on trying pencillin or augmentin or you would rather me send something else?

## 2021-10-19 NOTE — Progress Notes (Signed)
Diflucan for yeast after abx.

## 2021-10-28 ENCOUNTER — Other Ambulatory Visit: Payer: Self-pay | Admitting: Physician Assistant

## 2021-10-28 DIAGNOSIS — Z87442 Personal history of urinary calculi: Secondary | ICD-10-CM

## 2021-10-28 DIAGNOSIS — R319 Hematuria, unspecified: Secondary | ICD-10-CM

## 2021-10-28 DIAGNOSIS — M545 Low back pain, unspecified: Secondary | ICD-10-CM

## 2021-10-28 DIAGNOSIS — R109 Unspecified abdominal pain: Secondary | ICD-10-CM
